# Patient Record
Sex: Male | Born: 1950 | Race: White | Hispanic: No | Smoking: Never smoker
Health system: Southern US, Community
[De-identification: ages and names within clinical notes are randomized; demographics above are authoritative.]

## PROBLEM LIST (undated history)

## (undated) DIAGNOSIS — I2510 Atherosclerotic heart disease of native coronary artery without angina pectoris: Secondary | ICD-10-CM

## (undated) DIAGNOSIS — R0789 Other chest pain: Secondary | ICD-10-CM

## (undated) DIAGNOSIS — J45909 Unspecified asthma, uncomplicated: Secondary | ICD-10-CM

## (undated) DIAGNOSIS — F40240 Claustrophobia: Secondary | ICD-10-CM

## (undated) DIAGNOSIS — N529 Male erectile dysfunction, unspecified: Secondary | ICD-10-CM

## (undated) DIAGNOSIS — F4321 Adjustment disorder with depressed mood: Secondary | ICD-10-CM

## (undated) DIAGNOSIS — G4730 Sleep apnea, unspecified: Secondary | ICD-10-CM

## (undated) DIAGNOSIS — E669 Obesity, unspecified: Secondary | ICD-10-CM

## (undated) DIAGNOSIS — I10 Essential (primary) hypertension: Secondary | ICD-10-CM

## (undated) DIAGNOSIS — E785 Hyperlipidemia, unspecified: Secondary | ICD-10-CM

## (undated) DIAGNOSIS — F319 Bipolar disorder, unspecified: Secondary | ICD-10-CM

## (undated) DIAGNOSIS — R600 Localized edema: Secondary | ICD-10-CM

## (undated) DIAGNOSIS — R0602 Shortness of breath: Secondary | ICD-10-CM

## (undated) DIAGNOSIS — F419 Anxiety disorder, unspecified: Secondary | ICD-10-CM

## (undated) DIAGNOSIS — M1990 Unspecified osteoarthritis, unspecified site: Secondary | ICD-10-CM

## (undated) HISTORY — DX: Bipolar disorder, unspecified: F31.9

## (undated) HISTORY — DX: Sleep apnea, unspecified: G47.30

## (undated) HISTORY — DX: Unspecified asthma, uncomplicated: J45.909

## (undated) HISTORY — PX: TONSILLECTOMY: SUR1361

## (undated) HISTORY — DX: Obesity, unspecified: E66.9

## (undated) HISTORY — DX: Adjustment disorder with depressed mood: F43.21

## (undated) HISTORY — DX: Anxiety disorder, unspecified: F41.9

## (undated) HISTORY — DX: Localized edema: R60.0

## (undated) HISTORY — DX: Shortness of breath: R06.02

## (undated) HISTORY — DX: Male erectile dysfunction, unspecified: N52.9

## (undated) HISTORY — DX: Essential (primary) hypertension: I10

## (undated) HISTORY — DX: Claustrophobia: F40.240

## (undated) HISTORY — DX: Atherosclerotic heart disease of native coronary artery without angina pectoris: I25.10

## (undated) HISTORY — DX: Hyperlipidemia, unspecified: E78.5

## (undated) HISTORY — DX: Other chest pain: R07.89

## (undated) HISTORY — DX: Unspecified osteoarthritis, unspecified site: M19.90

## (undated) HISTORY — PX: TONSILLECTOMY AND ADENOIDECTOMY: SHX28

---

## 2003-08-20 ENCOUNTER — Ambulatory Visit (HOSPITAL_COMMUNITY): Admission: RE | Admit: 2003-08-20 | Discharge: 2003-08-20 | Payer: Self-pay | Admitting: Orthopedic Surgery

## 2004-08-26 ENCOUNTER — Ambulatory Visit: Payer: Self-pay | Admitting: Internal Medicine

## 2005-05-21 ENCOUNTER — Ambulatory Visit: Payer: Self-pay | Admitting: Internal Medicine

## 2005-10-01 ENCOUNTER — Ambulatory Visit: Payer: Self-pay | Admitting: Internal Medicine

## 2006-08-30 ENCOUNTER — Ambulatory Visit: Payer: Self-pay | Admitting: Internal Medicine

## 2006-08-30 LAB — CONVERTED CEMR LAB
ALT: 49 units/L — ABNORMAL HIGH (ref 0–40)
AST: 30 units/L (ref 0–37)
Albumin: 3.8 g/dL (ref 3.5–5.2)
Alkaline Phosphatase: 57 units/L (ref 39–117)
BUN: 20 mg/dL (ref 6–23)
Basophils Absolute: 0 10*3/uL (ref 0.0–0.1)
Basophils Relative: 0.6 % (ref 0.0–1.0)
Bilirubin Urine: NEGATIVE
CO2: 30 meq/L (ref 19–32)
Calcium: 9.5 mg/dL (ref 8.4–10.5)
Chloride: 103 meq/L (ref 96–112)
Chol/HDL Ratio, serum: 7.2
Cholesterol: 206 mg/dL (ref 0–200)
Creatinine, Ser: 1.2 mg/dL (ref 0.4–1.5)
Eosinophil percent: 4.7 % (ref 0.0–5.0)
GFR calc non Af Amer: 66 mL/min
Glomerular Filtration Rate, Af Am: 80 mL/min/{1.73_m2}
Glucose, Bld: 104 mg/dL — ABNORMAL HIGH (ref 70–99)
HCT: 48.4 % (ref 39.0–52.0)
HDL: 28.6 mg/dL — ABNORMAL LOW (ref >39.0)
Hemoglobin, Urine: NEGATIVE
Hemoglobin: 16.3 g/dL (ref 13.0–17.0)
Hgb A1c MFr Bld: 5.1 % (ref 4.6–6.0)
Ketones, ur: NEGATIVE mg/dL
LDL DIRECT: 154.9 mg/dL
Leukocytes, UA: NEGATIVE
Lymphocytes Relative: 27.2 % (ref 12.0–46.0)
MCHC: 33.7 g/dL (ref 30.0–36.0)
MCV: 89.7 fL (ref 78.0–100.0)
Monocytes Absolute: 0.7 10*3/uL (ref 0.2–0.7)
Monocytes Relative: 12.1 % — ABNORMAL HIGH (ref 3.0–11.0)
Neutro Abs: 2.9 10*3/uL (ref 1.4–7.7)
Neutrophils Relative %: 55.4 % (ref 43.0–77.0)
Nitrite: NEGATIVE
PSA: 0.49 ng/mL (ref 0.10–4.00)
Platelets: 208 10*3/uL (ref 150–400)
Potassium: 3.8 meq/L (ref 3.5–5.1)
RBC: 5.4 M/uL (ref 4.22–5.81)
RDW: 12 % (ref 11.5–14.6)
Sodium: 138 meq/L (ref 135–145)
Specific Gravity, Urine: >=1.03 (ref 1.000–1.03)
TSH: 1.77 microintl units/mL (ref 0.35–5.50)
Total Bilirubin: 1.1 mg/dL (ref 0.3–1.2)
Total Protein, Urine: NEGATIVE mg/dL
Total Protein: 7.3 g/dL (ref 6.0–8.3)
Triglyceride fasting, serum: 97 mg/dL (ref 0–149)
Urine Glucose: NEGATIVE mg/dL
Urobilinogen, UA: 0.2 (ref 0.0–1.0)
VLDL: 19 mg/dL (ref 0–40)
WBC: 5.4 10*3/uL (ref 4.5–10.5)
pH: 5 (ref 5.0–8.0)

## 2006-09-07 ENCOUNTER — Ambulatory Visit: Payer: Self-pay | Admitting: Internal Medicine

## 2006-10-11 ENCOUNTER — Ambulatory Visit: Payer: Self-pay | Admitting: Internal Medicine

## 2006-10-25 DIAGNOSIS — R0789 Other chest pain: Secondary | ICD-10-CM

## 2006-10-25 HISTORY — PX: CARDIAC CATHETERIZATION: SHX172

## 2006-10-25 HISTORY — DX: Other chest pain: R07.89

## 2006-11-14 ENCOUNTER — Ambulatory Visit: Payer: Self-pay | Admitting: Gastroenterology

## 2006-12-14 ENCOUNTER — Ambulatory Visit: Payer: Self-pay | Admitting: Internal Medicine

## 2006-12-14 LAB — CONVERTED CEMR LAB: Testosterone: 445.63 ng/dL (ref 350.00–890)

## 2007-02-13 ENCOUNTER — Observation Stay (HOSPITAL_COMMUNITY): Admission: AD | Admit: 2007-02-13 | Discharge: 2007-02-16 | Payer: Self-pay | Admitting: Internal Medicine

## 2007-02-13 ENCOUNTER — Ambulatory Visit: Payer: Self-pay | Admitting: Internal Medicine

## 2007-02-14 ENCOUNTER — Ambulatory Visit: Payer: Self-pay | Admitting: Internal Medicine

## 2007-02-15 ENCOUNTER — Ambulatory Visit: Payer: Self-pay | Admitting: Internal Medicine

## 2007-02-15 ENCOUNTER — Encounter: Payer: Self-pay | Admitting: Internal Medicine

## 2007-04-11 ENCOUNTER — Ambulatory Visit: Payer: Self-pay | Admitting: Internal Medicine

## 2007-04-11 DIAGNOSIS — G4730 Sleep apnea, unspecified: Secondary | ICD-10-CM | POA: Insufficient documentation

## 2007-04-11 DIAGNOSIS — I10 Essential (primary) hypertension: Secondary | ICD-10-CM | POA: Insufficient documentation

## 2007-04-11 DIAGNOSIS — K219 Gastro-esophageal reflux disease without esophagitis: Secondary | ICD-10-CM

## 2007-04-11 DIAGNOSIS — M79609 Pain in unspecified limb: Secondary | ICD-10-CM

## 2007-04-20 ENCOUNTER — Telehealth: Payer: Self-pay | Admitting: Internal Medicine

## 2007-05-01 ENCOUNTER — Encounter: Payer: Self-pay | Admitting: Internal Medicine

## 2007-05-02 ENCOUNTER — Telehealth (INDEPENDENT_AMBULATORY_CARE_PROVIDER_SITE_OTHER): Payer: Self-pay | Admitting: *Deleted

## 2007-05-10 ENCOUNTER — Encounter: Payer: Self-pay | Admitting: Internal Medicine

## 2007-05-11 ENCOUNTER — Encounter: Payer: Self-pay | Admitting: Neurology

## 2007-06-16 ENCOUNTER — Ambulatory Visit: Payer: Self-pay | Admitting: Cardiology

## 2007-10-26 HISTORY — PX: COLONOSCOPY: SHX174

## 2007-10-26 HISTORY — PX: OTHER SURGICAL HISTORY: SHX169

## 2008-04-17 ENCOUNTER — Ambulatory Visit: Payer: Self-pay | Admitting: Internal Medicine

## 2008-04-17 LAB — CONVERTED CEMR LAB
ALT: 24 units/L (ref 0–53)
AST: 21 units/L (ref 0–37)
Albumin: 3.9 g/dL (ref 3.5–5.2)
Alkaline Phosphatase: 65 units/L (ref 39–117)
Bilirubin, Direct: 0.1 mg/dL (ref 0.0–0.3)
Estradiol: 22.3 pg/mL
Free T4: 0.8 ng/dL (ref 0.6–1.6)
LH: 4 milliintl units/mL
PSA: 0.48 ng/mL (ref 0.10–4.00)
Prolactin: 8.9 ng/mL
TSH: 1.45 microintl units/mL (ref 0.35–5.50)
Testosterone: 362.36 ng/dL (ref 350.00–890)
Total Bilirubin: 1.2 mg/dL (ref 0.3–1.2)
Total Protein: 7.5 g/dL (ref 6.0–8.3)

## 2008-05-22 ENCOUNTER — Emergency Department (HOSPITAL_COMMUNITY): Admission: EM | Admit: 2008-05-22 | Discharge: 2008-05-22 | Payer: Self-pay | Admitting: *Deleted

## 2008-06-19 ENCOUNTER — Encounter: Payer: Self-pay | Admitting: Internal Medicine

## 2008-06-24 ENCOUNTER — Encounter: Payer: Self-pay | Admitting: Internal Medicine

## 2008-08-06 ENCOUNTER — Ambulatory Visit: Payer: Self-pay | Admitting: Internal Medicine

## 2008-08-06 DIAGNOSIS — R609 Edema, unspecified: Secondary | ICD-10-CM | POA: Insufficient documentation

## 2008-08-09 ENCOUNTER — Encounter: Payer: Self-pay | Admitting: Internal Medicine

## 2008-08-11 ENCOUNTER — Encounter: Admission: RE | Admit: 2008-08-11 | Discharge: 2008-08-11 | Payer: Self-pay | Admitting: Orthopedic Surgery

## 2008-08-12 LAB — CONVERTED CEMR LAB
ALT: 38 units/L (ref 0–53)
AST: 25 units/L (ref 0–37)
Albumin: 4.4 g/dL (ref 3.5–5.2)
Alkaline Phosphatase: 67 units/L (ref 39–117)
Bilirubin, Direct: 0.1 mg/dL (ref 0.0–0.3)
Cholesterol: 262 mg/dL — ABNORMAL HIGH (ref 0–200)
HDL: 62 mg/dL (ref >39)
Hgb A1c MFr Bld: 5.7 % (ref 4.6–6.1)
Indirect Bilirubin: 0.6 mg/dL (ref 0.0–0.9)
LDL Cholesterol: 169 mg/dL — ABNORMAL HIGH (ref 0–99)
TSH: 2.065 microintl units/mL (ref 0.350–4.50)
Total Bilirubin: 0.7 mg/dL (ref 0.3–1.2)
Total CHOL/HDL Ratio: 4.2
Total Protein: 7.5 g/dL (ref 6.0–8.3)
Triglycerides: 155 mg/dL — ABNORMAL HIGH (ref <150)
VLDL: 31 mg/dL (ref 0–40)

## 2008-11-13 ENCOUNTER — Telehealth (INDEPENDENT_AMBULATORY_CARE_PROVIDER_SITE_OTHER): Payer: Self-pay | Admitting: *Deleted

## 2009-04-03 ENCOUNTER — Ambulatory Visit: Payer: Self-pay | Admitting: Internal Medicine

## 2009-04-07 LAB — CONVERTED CEMR LAB
ALT: 35 units/L (ref 0–53)
AST: 28 units/L (ref 0–37)
Albumin: 4.1 g/dL (ref 3.5–5.2)
Alkaline Phosphatase: 70 units/L (ref 39–117)
BUN: 26 mg/dL — ABNORMAL HIGH (ref 6–23)
Basophils Absolute: 0 10*3/uL (ref 0.0–0.1)
Basophils Relative: 0.6 % (ref 0.0–3.0)
Bilirubin, Direct: 0.2 mg/dL (ref 0.0–0.3)
CO2: 29 meq/L (ref 19–32)
Calcium: 9.6 mg/dL (ref 8.4–10.5)
Chloride: 105 meq/L (ref 96–112)
Cholesterol: 220 mg/dL — ABNORMAL HIGH (ref 0–200)
Creatinine, Ser: 1.3 mg/dL (ref 0.4–1.5)
Direct LDL: 152.8 mg/dL
Eosinophils Absolute: 0.2 10*3/uL (ref 0.0–0.7)
Eosinophils Relative: 4.9 % (ref 0.0–5.0)
GFR calc non Af Amer: 59.9 mL/min (ref >60)
Glucose, Bld: 110 mg/dL — ABNORMAL HIGH (ref 70–99)
HCT: 44.1 % (ref 39.0–52.0)
HDL: 49.9 mg/dL (ref >39.00)
Hemoglobin: 15.6 g/dL (ref 13.0–17.0)
Hgb A1c MFr Bld: 5.5 % (ref 4.6–6.5)
Lymphocytes Relative: 27.8 % (ref 12.0–46.0)
Lymphs Abs: 1.4 10*3/uL (ref 0.7–4.0)
MCHC: 35.4 g/dL (ref 30.0–36.0)
MCV: 89 fL (ref 78.0–100.0)
Monocytes Absolute: 0.5 10*3/uL (ref 0.1–1.0)
Monocytes Relative: 9.6 % (ref 3.0–12.0)
Neutro Abs: 2.8 10*3/uL (ref 1.4–7.7)
Neutrophils Relative %: 57.1 % (ref 43.0–77.0)
PSA: 0.54 ng/mL (ref 0.10–4.00)
Platelets: 175 10*3/uL (ref 150.0–400.0)
Potassium: 4.6 meq/L (ref 3.5–5.1)
RBC: 4.95 M/uL (ref 4.22–5.81)
RDW: 13.1 % (ref 11.5–14.6)
Sodium: 142 meq/L (ref 135–145)
TSH: 1.17 microintl units/mL (ref 0.35–5.50)
Total Bilirubin: 1.3 mg/dL — ABNORMAL HIGH (ref 0.3–1.2)
Total CHOL/HDL Ratio: 4
Total Protein: 7.3 g/dL (ref 6.0–8.3)
Triglycerides: 83 mg/dL (ref 0.0–149.0)
VLDL: 16.6 mg/dL (ref 0.0–40.0)
WBC: 4.9 10*3/uL (ref 4.5–10.5)

## 2009-04-08 ENCOUNTER — Encounter (INDEPENDENT_AMBULATORY_CARE_PROVIDER_SITE_OTHER): Payer: Self-pay | Admitting: *Deleted

## 2009-06-24 ENCOUNTER — Ambulatory Visit: Payer: Self-pay | Admitting: Internal Medicine

## 2009-06-24 DIAGNOSIS — R5381 Other malaise: Secondary | ICD-10-CM

## 2009-06-24 DIAGNOSIS — R519 Headache, unspecified: Secondary | ICD-10-CM | POA: Insufficient documentation

## 2009-06-24 DIAGNOSIS — R51 Headache: Secondary | ICD-10-CM

## 2009-06-24 DIAGNOSIS — R5383 Other fatigue: Secondary | ICD-10-CM

## 2009-06-25 ENCOUNTER — Encounter: Payer: Self-pay | Admitting: Internal Medicine

## 2010-01-19 ENCOUNTER — Telehealth (INDEPENDENT_AMBULATORY_CARE_PROVIDER_SITE_OTHER): Payer: Self-pay | Admitting: *Deleted

## 2010-04-13 ENCOUNTER — Telehealth (INDEPENDENT_AMBULATORY_CARE_PROVIDER_SITE_OTHER): Payer: Self-pay | Admitting: *Deleted

## 2010-05-26 ENCOUNTER — Telehealth (INDEPENDENT_AMBULATORY_CARE_PROVIDER_SITE_OTHER): Payer: Self-pay | Admitting: *Deleted

## 2010-05-27 ENCOUNTER — Ambulatory Visit: Payer: Self-pay | Admitting: Internal Medicine

## 2010-05-27 LAB — CONVERTED CEMR LAB
ALT: 25 units/L (ref 0–53)
AST: 20 units/L (ref 0–37)
Albumin: 4 g/dL (ref 3.5–5.2)
Alkaline Phosphatase: 67 units/L (ref 39–117)
BUN: 29 mg/dL — ABNORMAL HIGH (ref 6–23)
Basophils Absolute: 0 10*3/uL (ref 0.0–0.1)
Basophils Relative: 0.7 % (ref 0.0–3.0)
Bilirubin, Direct: 0.1 mg/dL (ref 0.0–0.3)
CO2: 30 meq/L (ref 19–32)
Calcium: 9.3 mg/dL (ref 8.4–10.5)
Chloride: 99 meq/L (ref 96–112)
Cholesterol: 238 mg/dL — ABNORMAL HIGH (ref 0–200)
Creatinine, Ser: 1.1 mg/dL (ref 0.4–1.5)
Direct LDL: 170.9 mg/dL
Eosinophils Absolute: 0.3 10*3/uL (ref 0.0–0.7)
Eosinophils Relative: 4.4 % (ref 0.0–5.0)
GFR calc non Af Amer: 71.6 mL/min (ref >60)
Glucose, Bld: 89 mg/dL (ref 70–99)
HCT: 43 % (ref 39.0–52.0)
HDL: 42.7 mg/dL (ref >39.00)
Hemoglobin: 15 g/dL (ref 13.0–17.0)
Hgb A1c MFr Bld: 5.6 % (ref 4.6–6.5)
Lymphocytes Relative: 30.7 % (ref 12.0–46.0)
Lymphs Abs: 2.3 10*3/uL (ref 0.7–4.0)
MCHC: 34.8 g/dL (ref 30.0–36.0)
MCV: 90.4 fL (ref 78.0–100.0)
Monocytes Absolute: 0.7 10*3/uL (ref 0.1–1.0)
Monocytes Relative: 8.9 % (ref 3.0–12.0)
Neutro Abs: 4.1 10*3/uL (ref 1.4–7.7)
Neutrophils Relative %: 55.3 % (ref 43.0–77.0)
PSA: 0.4 ng/mL (ref 0.10–4.00)
Platelets: 217 10*3/uL (ref 150.0–400.0)
Potassium: 4.3 meq/L (ref 3.5–5.1)
RBC: 4.76 M/uL (ref 4.22–5.81)
RDW: 13.8 % (ref 11.5–14.6)
Sodium: 137 meq/L (ref 135–145)
TSH: 1.85 microintl units/mL (ref 0.35–5.50)
Total Bilirubin: 0.9 mg/dL (ref 0.3–1.2)
Total CHOL/HDL Ratio: 6
Total Protein: 7.2 g/dL (ref 6.0–8.3)
Triglycerides: 121 mg/dL (ref 0.0–149.0)
VLDL: 24.2 mg/dL (ref 0.0–40.0)
WBC: 7.3 10*3/uL (ref 4.5–10.5)

## 2010-05-29 ENCOUNTER — Ambulatory Visit: Payer: Self-pay | Admitting: Internal Medicine

## 2010-05-29 DIAGNOSIS — E785 Hyperlipidemia, unspecified: Secondary | ICD-10-CM | POA: Insufficient documentation

## 2010-05-29 DIAGNOSIS — J45909 Unspecified asthma, uncomplicated: Secondary | ICD-10-CM | POA: Insufficient documentation

## 2010-08-21 ENCOUNTER — Encounter: Payer: Self-pay | Admitting: Cardiology

## 2010-08-21 ENCOUNTER — Ambulatory Visit: Payer: Self-pay | Admitting: Cardiology

## 2010-08-28 ENCOUNTER — Encounter: Payer: Self-pay | Admitting: Internal Medicine

## 2010-08-28 ENCOUNTER — Encounter: Admission: RE | Admit: 2010-08-28 | Discharge: 2010-08-28 | Payer: Self-pay | Admitting: Allergy

## 2010-09-02 ENCOUNTER — Encounter: Payer: Self-pay | Admitting: Internal Medicine

## 2010-10-09 ENCOUNTER — Telehealth: Payer: Self-pay | Admitting: Internal Medicine

## 2010-10-13 ENCOUNTER — Encounter: Payer: Self-pay | Admitting: Internal Medicine

## 2010-10-13 ENCOUNTER — Ambulatory Visit: Payer: Self-pay | Admitting: Internal Medicine

## 2010-10-14 LAB — CONVERTED CEMR LAB: Sed Rate: 13 mm/hr (ref 0–22)

## 2010-11-13 ENCOUNTER — Encounter: Payer: Self-pay | Admitting: Internal Medicine

## 2010-11-14 ENCOUNTER — Encounter: Payer: Self-pay | Admitting: Orthopedic Surgery

## 2010-11-16 ENCOUNTER — Other Ambulatory Visit: Payer: Self-pay | Admitting: Neurosurgery

## 2010-11-19 ENCOUNTER — Encounter
Admission: RE | Admit: 2010-11-19 | Discharge: 2010-11-19 | Payer: Self-pay | Source: Home / Self Care | Attending: Neurosurgery | Admitting: Neurosurgery

## 2010-11-22 LAB — CONVERTED CEMR LAB
CK-MB: 3.3 ng/mL (ref 0.3–4.0)
Cholesterol, target level: 200 mg/dL
Free T4: 0.8 ng/dL (ref 0.6–1.6)
HDL goal, serum: 40 mg/dL
LDL Goal: 130 mg/dL
T3, Free: 3.1 pg/mL (ref 2.3–4.2)
TSH: 1.65 microintl units/mL (ref 0.35–5.50)
Total CK: 125 units/L (ref 7–195)
Troponin I: 0.11 ng/mL — ABNORMAL HIGH (ref <0.06)

## 2010-11-26 NOTE — Letter (Signed)
Summary: [facility]  [facility]   Imported By: Lanelle Bal 09/18/2010 08:53:48  _____________________________________________________________________  External Attachment:    Type:   Image     Comment:   External Document

## 2010-11-26 NOTE — Miscellaneous (Signed)
Summary: Appointment Canceled  Appointment status changed to canceled by [employer] on 09/02/2010 12:58 PM.  Cancellation Comments --------------------- echo dx 272.4/bcbs/sl  Appointment Information ----------------------- Appt Type:  CARDIOLOGY ANCILLARY VISIT      Date:  Wednesday, September 09, 2010      Time:  1:00 PM for 60 min   Urgency:  Routine   Made By:  Hoy Finlay Scheduler  To Visit:  [id number]    Reason:  echo dx 272.4/bcbs/sl  Appt Comments ------------- -- 09/02/10 12:58: ([facility]EM[facility]) CANCELED -- echo dx 272.4/bcbs/sl -- 08/21/10 9:58: ([facility]EM[facility]) BOOKED -- Routine CARDIOLOGY ANCILLARY VISIT at 09/09/2010 1:00 PM for 60 min echo dx 272.4/bcbs/sl -- 08/21/10 9:48: ([facility]) BOOKED -- Routine CARDIOLOGY ANCILLARY V

## 2010-11-26 NOTE — Progress Notes (Signed)
Summary: verapamil and micardis  Phone Note Refill Request Message from:  Pharmacy on [facility] Fax #: [phone]  Refills Requested: Medication #1:  VERAPAMIL HCL CR 120 MG XR24H-CAP 1 once daily   Dosage confirmed as above?Dosage Confirmed   Supply Requested: 1 month   Last Refilled: 12/18/2009  Medication #2:  MICARDIS HCT 80-25 MG TABS 1 by mouth once daily   Dosage confirmed as above?Dosage Confirmed   Supply Requested: 1 month   Notes: per patient request Next Appointment Scheduled: none Initial call taken by: Harold Barban,  January 19, 2010 8:52 AM    Prescriptions: VERAPAMIL HCL CR 120 MG XR24H-CAP (VERAPAMIL HCL) 1 once daily  #30 Each x 2   Entered by:   Doristine Devoid   Authorized by:   Marga Melnick MD   Signed by:   Doristine Devoid on 01/19/2010   Method used:   Electronically to        [facility]* (retail)       [address]       [place], [place]  [address]       Ph: [phone]       Fax: [phone]   RxID:   [id number] MICARDIS HCT 80-25 MG TABS (TELMISARTAN-HCTZ) 1 by mouth once daily  #30 x 2   Entered by:   Doristine Devoid   Authorized by:   Marga Melnick MD   Signed by:   Doristine Devoid on 01/19/2010   Method used:   Electronically to        [facility]* (retail)       [address]       [place], [place]  [id number]       Ph: [phone]       Fax: [phone]   RxID:   [id number]

## 2010-11-26 NOTE — Progress Notes (Signed)
Summary: REFILL REQUEST  Phone Note Refill Request Call back at [phone] Message from:  Pharmacy on April 13, 2010 2:51 PM  Refills Requested: Medication #1:  VERAPAMIL HCL CR 120 MG XR24H-CAP 1 once daily   Dosage confirmed as above?Dosage Confirmed   Supply Requested: 1 month [facility]  Next Appointment Scheduled: NONE Initial call taken by: Lavell Islam,  April 13, 2010 2:51 PM    Prescriptions: VERAPAMIL HCL CR 120 MG XR24H-CAP (VERAPAMIL HCL) 1 once daily  #30 Each x 2   Entered by:   Shonna Chock   Authorized by:   Marga Melnick MD   Signed by:   Shonna Chock on 04/13/2010   Method used:   Electronically to        [facility]* (retail)       [address]       [place], [place]  [address]       Ph: [phone]       Fax: [phone]   RxID:   [id number]

## 2010-11-26 NOTE — Progress Notes (Signed)
Summary: Refill Request  Phone Note Refill Request Call back at [phone] Message from:  Pharmacy on April 13, 2010 2:17 PM  Refills Requested: Medication #1:  MICARDIS HCT 80-25 MG TABS 1 by mouth once daily   Dosage confirmed as above?Dosage Confirmed   Supply Requested: 1 month   Last Refilled: 03/16/2010 [facility]  Next Appointment Scheduled: none Initial call taken by: Harold Barban,  April 13, 2010 2:17 PM    Prescriptions: MICARDIS HCT 80-25 MG TABS (TELMISARTAN-HCTZ) 1 by mouth once daily  #30 x 2   Entered by:   Shonna Chock   Authorized by:   Marga Melnick MD   Signed by:   Shonna Chock on 04/13/2010   Method used:   Electronically to        [facility]* (retail)       [address]       [place], [place]  [address]       Ph: [phone]       Fax: [phone]   RxID:   [id number]

## 2010-11-26 NOTE — Assessment & Plan Note (Signed)
Summary: bp check/cbs   Vital Signs:  Patient profile:   60 year old male Weight:      320.2 pounds Pulse rate:   72 / minute Resp:     12 per minute BP sitting:   138 / 80  (left arm) Cuff size:   large  Vitals Entered By: Shonna Chock CMA (May 29, 2010 7:59 AM) CC: Follow-up visit: discuss labs (copy given) and B/P, Lipid Management   CC:  Follow-up visit: discuss labs (copy given) and B/P and Lipid Management.  History of Present Illness:  Hypertension Follow-Up      This is a 60 year old man who presents for Hypertension follow-up.  The patient reports  occasional lightheadedness especially in the heat, but denies urinary frequency, headaches, and fatigue.  Associated symptoms include dyspnea( EIB variant) and pedal edema(this has improved).  The patient denies the following associated symptoms: chest pain, chest pressure, exercise intolerance, palpitations, syncope, and leg edema.  Compliance with medications (by patient report) has been near 100%.  The patient reports that dietary compliance has been good.  The patient reports exercising daily.  Adjunctive measures currently used by the patient include salt restriction.  BP 125-135/70-84. Hyperlipidemia Follow-Up      The patient also presents for Hyperlipidemia follow-up.  The patient reported  muscle aches and  loose stool with several statins( Crestor , Zocor, Lipitor)  & Zetia. He  denied  abdominal pain, flushing, itching, and constipation with these.  Adjunctive measures currently used by the patient include ASA and fish oil supplements.  LDL 171 ; NMR reviewed & risk discussed.                                                                        His asthma has flared slightly with the recent intense heat. No rescue inhaler use .  Lipid Management History:      Positive NCEP/ATP III risk factors include male age 21 years old or older and hypertension.  Negative NCEP/ATP III risk factors include non-diabetic, no family  history for ischemic heart disease, non-tobacco-user status, no ASHD (atherosclerotic heart disease), no prior stroke/TIA, no peripheral vascular disease, and no history of aortic aneurysm.     Preventive Screening-Counseling & Management  Alcohol-Tobacco     Smoking Status: quit  Allergies: 1)  ! * Advair 2)  ! * Maxair 3)  ! Toprol Xl (Metoprolol Succinate)  Past History:  Past Medical History: Non cardiac chest and arm pain 2008, Dr Charlies Constable Positive risk factor for CAD with a positive family hx: Maternal FH CAD Hyperlipidemia: NMR 2006: LDL 045(4098/ 1907), HDL 43, TG 82. LDL goal = < 100. Framingham Study LDL goal = < 130.  Family History: Father: Dementia, Pancreatic cancer, Glaucoma, Prostate cancer, PMR Mother: suicide @ 44 Siblings: sister & bro overweight; M uncles: MI in 37s; M cousins: MI in 62s; MGF: MI or CVA  Social History: Occupation: Ophthalmologist Married Alcohol use-yes: socially Regular exercise-yes until recent family issues Former Smoker Smoking Status:  quit  Review of Systems CV:  Denies difficulty breathing at night and difficulty breathing while lying down. Resp:  Denies cough and sputum productive.  Physical Exam  General:  well-nourished; alert,appropriate and  cooperative throughout examination Neck:  No deformities, masses, or tenderness noted. Lungs:  Normal respiratory effort, chest expands symmetrically. Lungs are clear to auscultation, no crackles or wheezes. Heart:  Normal rate and regular rhythm. S1 and S2 normal without gallop, murmur, click, rub . Abdomen:  Bowel sounds positive,abdomen soft and non-tender without masses, organomegaly or hernias noted. Pulses:  R and L carotid,radial,dorsalis pedis and posterior tibial pulses are full and equal bilaterally Extremities:  No clubbing, cyanosis, edema Neurologic:  alert & oriented X3 and DTRs symmetrical and normal.   Skin:  Intact without suspicious lesions or rashes Psych:   memory intact for recent and remote, normally interactive, and good eye contact.     Impression & Recommendations:  Problem # 1:  HYPERTENSION, ESSENTIAL NOS (ICD-401.9)  The following medications were removed from the medication list:    Micardis Hct 80-25 Mg Tabs (Telmisartan-hctz) .Marland Kitchen... 1 by mouth once daily His updated medication list for this problem includes:    Furosemide 40 Mg Tabs (Furosemide) .Marland Kitchen... 1/2 - 1 once daily as needed    Verapamil Hcl Cr 120 Mg Xr24h-cap (Verapamil hcl) .Marland Kitchen... 1 once daily    Micardis Hct 80-12.5 Mg Tabs (Telmisartan-hctz) .Marland Kitchen... 1 once daily  Problem # 2:  HYPERLIPIDEMIA (ICD-272.4)  His updated medication list for this problem includes:    Pravastatin Sodium 20 Mg Tabs (Pravastatin sodium) .Marland Kitchen... 1 at bedtime  Problem # 3:  ASTHMA (ICD-493.90) Excellent control His updated medication list for this problem includes:    Symbicort 160-4.5 Mcg/act Aero (Budesonide-formoterol fumarate) .Marland Kitchen... Take two puffs twice daily.  Complete Medication List: 1)  Viagra 100 Mg Tabs (Sildenafil citrate) .Marland Kitchen.. 1 once daily prn 2)  Symbicort 160-4.5 Mcg/act Aero (Budesonide-formoterol fumarate) .... Take two puffs twice daily. 3)  Furosemide 40 Mg Tabs (Furosemide) .... 1/2 - 1 once daily as needed 4)  Cialis 20 Mg Tabs (Tadalafil) .Marland Kitchen.. 1 every 3 days as needed 5)  Nasel Spray  .... As needed 6)  Bayer Low Strength 81 Mg Tbec (Aspirin) .Marland Kitchen.. 1 by mouth once daily 7)  Verapamil Hcl Cr 120 Mg Xr24h-cap (Verapamil hcl) .Marland Kitchen.. 1 once daily 8)  Micardis Hct 80-12.5 Mg Tabs (Telmisartan-hctz) .Marland Kitchen.. 1 once daily 9)  Pravastatin Sodium 20 Mg Tabs (Pravastatin sodium) .Marland Kitchen.. 1 at bedtime  Lipid Assessment/Plan:      Based on NCEP/ATP III, the patient's risk factor category is "2 or more risk factors and a calculated 10 year CAD risk of < 20%".  The patient's lipid goals are as follows: Total cholesterol goal is 200; LDL cholesterol goal is 130; HDL cholesterol goal is 40;  Triglyceride goal is 150.    Patient Instructions: 1)  It is important that you exercise regularly at least 20 minutes 5 times a week. If you develop chest pain, have severe difficulty breathing, or feel very tired , stop exercising immediately and seek medical attention. 2)  Take an  81 mg coated Aspirin every day. 3)  Please schedule a follow-up appointment in 3 months. 4)  Hepatic Panel , CPK prior to visit, ICD-9:995.20 5)  Lipid Panel prior to visit, ICD-9:272.4, V17.3 Prescriptions: PRAVASTATIN SODIUM 20 MG TABS (PRAVASTATIN SODIUM) 1 at bedtime  #90 x 0   Entered and Authorized by:   Marga Melnick MD   Signed by:   Marga Melnick MD on 05/29/2010   Method used:   Print then Give to Patient   RxID:   [id number] VERAPAMIL HCL CR 120 MG XR24H-CAP (VERAPAMIL HCL)  1 once daily  #90 x 3   Entered and Authorized by:   Marga Melnick MD   Signed by:   Marga Melnick MD on 05/29/2010   Method used:   Print then Give to Patient   RxID:   [id number] MICARDIS HCT 80-12.5 MG TABS (TELMISARTAN-HCTZ) 1 once daily  #90 x 3   Entered and Authorized by:   Marga Melnick MD   Signed by:   Marga Melnick MD on 05/29/2010   Method used:   Print then Give to Patient   RxID:   [id number]

## 2010-11-26 NOTE — Miscellaneous (Signed)
Summary: Orders Update  Clinical Lists Changes  Orders: Added new Service order of EKG w/ Interpretation (93000) - Signed 

## 2010-11-26 NOTE — Assessment & Plan Note (Signed)
Summary: ec6  Medications Added ASTELIN 137 MCG/SPRAY SOLN (AZELASTINE HCL) use as directed FISH OIL 1000 MG CAPS (OMEGA-3 FATTY ACIDS) 1 cap once daily * MACULAR COMPLETE 1 tab once daily        Visit Type:  EC6  CC:  pt last seen in office in 2008.....  History of Present Illness: [name] is 60 years old and came for followup management of hypertension, hyperlipidemia, and preventive cardiovascular maintenance. He was hospitalized in 2008 with chest pain and underwent cardiac catheterization and had normal coronary arteries. He had an echocardiogram that time which showed some LVH with a septal thickness of 16 mm and a posterior wall thickness of 12 mm.  He is done well since that time from the standpoint of his heart and has had no recent cardiac symptoms.  He does have a problem with excess weight hyperlipidemia and hypertension. Hypertension skin well controlled but his lipids have been difficult to control. He's been intolerant to beta blockers and his been intolerant to multiple statins including Crestor Zocor Lipitor and steady it. [name] recently started on pravastatin. His last lipid profile in August showed an LDL 170 and a total cholesterol of 238. His weight today was 319 pounds.  Current Medications (verified): 1)  Viagra 100 Mg  Tabs (Sildenafil Citrate) .Marland Kitchen.. 1 Once Daily Prn 2)  Symbicort 160-4.5 Mcg/act Aero (Budesonide-Formoterol Fumarate) .... Take Two Puffs Twice Daily. 3)  Cialis 20 Mg Tabs (Tadalafil) .Marland Kitchen.. 1 Every 3 Days As Needed 4)  Astelin 137 Mcg/spray Soln (Azelastine Hcl) .... Use As Directed 5)  Bayer Low Strength 81 Mg Tbec (Aspirin) .Marland Kitchen.. 1 By Mouth Once Daily 6)  Verapamil Hcl Cr 120 Mg Xr24h-Cap (Verapamil Hcl) .Marland Kitchen.. 1 Once Daily 7)  Micardis Hct 80-12.5 Mg Tabs (Telmisartan-Hctz) .Marland Kitchen.. 1 Once Daily 8)  Pravastatin Sodium 20 Mg Tabs (Pravastatin Sodium) .Marland Kitchen.. 1 At Bedtime 9)  Fish Oil 1000 Mg Caps (Omega-3 Fatty Acids) .Marland Kitchen.. 1 Cap Once Daily 10)   Macular Complete .Marland Kitchen.. 1 Tab Once Daily  Allergies: 1)  ! * Advair 2)  ! * Maxair 3)  ! Toprol Xl (Metoprolol Succinate)  Past History:  Past Medical History: Reviewed history from 05/29/2010 and no changes required. Non cardiac chest and arm pain 2008, Dr Charlies Constable Positive risk factor for CAD with a positive family hx: Maternal FH CAD Hyperlipidemia: NMR 2006: LDL 045(4098/ 1907), HDL 43, TG 82. LDL goal = < 100. Framingham Study LDL goal = < 130.  Review of Systems       ROS is negative except as outlined in HPI.   Vital Signs:  Patient profile:   60 year old male Height:      76 inches Weight:      319.75 pounds BMI:     39.06 Pulse rate:   77 / minute Pulse rhythm:   regular BP sitting:   128 / 86  (left arm) Cuff size:   large  Vitals Entered By: Danielle Rankin, CMA (August 21, 2010 9:01 AM)  Physical Exam  Additional Exam:  Gen. Well-nourished, in no distress   Neck: No JVD, thyroid not enlarged, no carotid bruits Lungs: No tachypnea, clear without rales, rhonchi or wheezes Cardiovascular: Rhythm regular, PMI not displaced,  heart sounds  normal, no murmurs or gallops, no peripheral edema, pulses normal in all 4 extremities. Abdomen: BS normal, abdomen soft and non-tender without masses or organomegaly, no hepatosplenomegaly. MS: No deformities, no cyanosis or clubbing   Neuro:  No focal sns   Skin:  no lesions    Impression & Recommendations:  Problem # 1:  HYPERTENSION, ESSENTIAL NOS (ICD-401.9) His blood pressure is well controlled on current medications. When he had his previous echocardiogram in [facility]. We will plan to repeat his echocardiogram to assess progression or regression of his hypertrophy. The following medications were removed from the medication list:    Furosemide 40 Mg Tabs (Furosemide) .Marland Kitchen... 1/2 - 1 once daily as needed His updated medication list for this problem includes:    Bayer Low Strength 81 Mg Tbec (Aspirin) .Marland Kitchen... 1 by mouth once  daily    Verapamil Hcl Cr 120 Mg Xr24h-cap (Verapamil hcl) .Marland Kitchen... 1 once daily    Micardis Hct 80-12.5 Mg Tabs (Telmisartan-hctz) .Marland Kitchen... 1 once daily  Orders: EKG w/ Interpretation (93000) Echocardiogram (Echo)  Problem # 2:  HYPERLIPIDEMIA (ICD-272.4) His LDL is quite high. He is now on pravastatin which did help some. The main issue is weight reduction. He understands this is the main issue in we'll try to focus on this and has been working with [name] on this. His updated medication list for this problem includes:    Pravastatin Sodium 20 Mg Tabs (Pravastatin sodium) .Marland Kitchen... 1 at bedtime  Orders: EKG w/ Interpretation (93000) Echocardiogram (Echo)  Patient Instructions: 1)  Your physician wants you to follow-up in:  1 year with Dr. Riley Kill. You will receive a reminder letter in the mail two months in advance. If you don't receive a letter, please call our office to schedule the follow-up appointment. 2)  Your physician has requested that you have an echocardiogram.  Echocardiography is a painless test that uses sound waves to create images of your heart. It provides your doctor with information about the size and shape of your heart and how well your heart's chambers and valves are working.  This procedure takes approximately one hour. There are no restrictions for this procedure.

## 2010-11-26 NOTE — Progress Notes (Signed)
Summary: Pravastatin rx  Phone Note Refill Request   Refills Requested: Medication #1:  PRAVASTATIN SODIUM 20 MG TABS 1 at bedtime RX sent to [facility] per MD  Initial call taken by: Lucious Groves CMA,  October 09, 2010 8:58 AM    Prescriptions: PRAVASTATIN SODIUM 20 MG TABS (PRAVASTATIN SODIUM) 1 at bedtime  #90 x 3   Entered by:   Lucious Groves CMA   Authorized by:   Marga Melnick MD   Signed by:   Lucious Groves CMA on 10/09/2010   Method used:   Electronically to        [facility]* (retail)       [address]       [place], [place]  [id number]       Ph: [phone]       Fax: [phone]   RxID:   [id number]

## 2010-11-26 NOTE — Progress Notes (Signed)
Summary: lab  Phone Note Call from Patient   Caller: Patient Summary of Call: patient has appt at [facility] [phone] - appt [phone] need lab order  Initial call taken by: Okey Regal Spring,  May 26, 2010 8:12 AM  Follow-up for Phone Call        Dr.Hopper please advise is this suppose to be CPX labs, patient not with a CPX appointment pending Follow-up by: Shonna Chock CMA,  May 26, 2010 9:42 AM  Additional Follow-up for Phone Call Additional follow up Details #1::        V70.0:lipids, BMET, hepatic panel, CBC & dif, TSH, PSA Additional Follow-up by: Marga Melnick MD,  May 26, 2010 1:02 PM    Additional Follow-up for Phone Call Additional follow up Details #2::    lab order added to appt.Okey Regal Spring  May 26, 2010 1:56 PM

## 2010-12-02 ENCOUNTER — Encounter: Payer: Self-pay | Admitting: Internal Medicine

## 2010-12-10 NOTE — Letter (Signed)
Summary: [facility]  [facility]   Imported By: Sherian Rein 12/01/2010 10:31:20  _____________________________________________________________________  External Attachment:    Type:   Image     Comment:   External Document

## 2010-12-22 NOTE — Letter (Signed)
Summary: [facility]  [facility]   Imported By: Maryln Gottron 12/15/2010 15:05:30  _____________________________________________________________________  External Attachment:    Type:   Image     Comment:   External Document

## 2011-03-09 NOTE — Assessment & Plan Note (Signed)
[facility]                            CARDIOLOGY OFFICE NOTE   NAME:Acri, DR.Algis                      MRN:          [id number]  DATE:06/16/2007                            DOB:          1951-03-24    PRIMARY CARE PHYSICIAN:  Marga Melnick, M.D.   HISTORY OF PRESENT ILLNESS:  [name] returns for a followup visit after  his catheterization earlier this year for chest pain and for management  of his hypertension. He was hospitalized in April with chest pain and  underwent Myoview perfusion scan, which was borderline abnormal. He  underwent catheterization and was found to have normal coronaries,  although he did have TIMI-2 flow in the right coronary artery. He also  had hypertension and initially put on beta blockers but he developed  cool hands and feet and was switched by Micardis by [name]. Dr.  [name] had done a troponin level in June, when he saw him and it was  0.11 and said he had had some left shoulder pain. He attributed this to  his hypertension. [name] had also had an echocardiogram while he was in  the hospital that showed some LVH with a septal thickness of 16 mm and a  posterior wall thickness of 12 mm.   [name] has done quite well since that time. He has had no recent chest  pain, shortness of breath, or palpitations. He has taken his blood  pressure at home and they have been in the 150's and a systolic  generally in the 80's and occasionally 90 on the diastolic side.   PAST MEDICAL HISTORY:  Significant for asthma, borderline  hyperlipidemia, and his weight.   CURRENT MEDICATIONS:  Include Micardis, Astelin nasal spray, and  aspirin.   PHYSICAL EXAMINATION:  VITAL SIGNS:  Today blood pressure was 130/90 and  pulse 70 and regular.  NECK:  There was no jugular venous distention. Carotid pulses were full  without bruit.  CHEST:  Clear without rales or rhonchi. Rhythm was regular. No murmurs  or gallops.  ABDOMEN:  Soft without  organomegaly.  EXTREMITIES:  Peripheral pulses were full. There is no peripheral edema.   IMPRESSION:  1. Hypertension.  2. Coronary angiography without evidence of obstructive coronary      disease.  3. Good left ventricular function.  4. Borderline hyperlipidemia.   RECOMMENDATIONS:  [name] and I talked about non-pharmacologic means to help  get his blood pressure to target, including weight reduction exercise  and salt restriction. He plans to work on these. I think his blood  pressure elevations are significant in view of his LVD on his  echocardiogram and I think we should adjust his pharmacologic therapy  while he is working on non-pharmacologic treatment. I will change him  from Micardis 80 to Micardis/hydrochlorothiazide 80/25. Will plan to get  a BNP in a week to make sure his  potassium is okay on the hydrochlorothiazide. I will schedule him to  followup in 6 months and he is to monitor his blood pressure and let us  know if he has not reached target of 130/80.  [name], MD, [facility]  Electronically Signed    BRB/MedQ  DD: 06/16/2007  DT: 06/17/2007  Job #: [id number]
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
## 2011-03-12 NOTE — Consult Note (Signed)
[name], [name]                ACCOUNT NO.:  [id number]   MEDICAL RECORD NO.:  [id number]          PATIENT TYPE:  INP   LOCATION:  2006                         FACILITY:  [facility]   PHYSICIAN:  Anthony Beals. Juanda Chance, MD, FACCDATE OF BIRTH:  03/16/1951   DATE OF CONSULTATION:  02/13/2007  DATE OF DISCHARGE:                                 CONSULTATION   Primary care physician is Dr. Marga Oliver.   REASON FOR CONSULTATION:  Evaluation of chest pain.   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  He has had recent symptoms of  depression and has been under some increased stress and workload at work  and has had some arguments with his wife [name] regarding this.  Today,  he developed some substernal chest discomfort and was seen by Dr. Alwyn Oliver  in his office.  An ECG was obtained which showed some T-wave flattening  in the anterior leads which was new from his previous ECG in 2007.  Dr.  Alwyn Oliver transferred him here by ambulance.   His risk profile for vascular disease include his age and sex, excess  weight, although he has lost 40 pounds recently, and a history of an  elevated cholesterol, and family history described below.   He has had no symptoms of shortness of breath, palpitations or  exertional chest pain.   PAST MEDICAL HISTORY:  Significant for a history of depression.  He has  seen Dr. Andee Oliver recently, but is currently on no medications  for this.  His past history is also significant for asthma and elevated  cholesterol.  He indicated that he has a low HDL and increased small  particle size LDL.  He is on no medications for this.  He has been  hypertensive today, but he has never required treatment for this in the  past.   CURRENT MEDICATIONS:  Include only Ambien and aspirin.   SOCIAL HISTORY:  He is married for the second time and has 2 children by  his first wife.  He does not smoke.  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   FAMILY HISTORY:  His mother had 4 sets of twins in the family and 2 of  those sets of twins had heart attacks at a fairly young age.  There is  no heart disease on his father's side.   REVIEW OF SYSTEMS:  Positive for intermittent symptoms of depression.   PHYSICAL EXAMINATION:  VITAL SIGNS:  The blood pressure is 194/101 with  pulse 73 and regular.  NECK:  There was no venous tension.  The carotid pulses were full.  There were no bruits.  CHEST:  Clear without rales or rhonchi.  CARDIAC:  Rhythm was regular.  The first second heart sounds were  normal.  There were no murmurs or gallops.  ABDOMEN:  Soft with normal bowel sounds.  There was no  hepatosplenomegaly.  EXTREMITIES:  The peripheral pulses were full.  There is no peripheral  edema.  Musculoskeletal did not show  no deformities.  SKIN:  Warm and dry.  NEUROLOGIC:  Examination showed no focal neurological signs.   An electrocardiogram showed borderline right bundle branch block and  left axis deviation and T-wave flattening in V1 and V2.   IMPRESSION:  1. Chest pain and abnormal echocardiogram, rule out ischemia.  2. Recent hypertension.  3. History of elevated cholesterol, treated with diet.  4. History depression.  5. History of asthma.   RECOMMENDATIONS:  I talked with [name] about the possible options for  evaluating his chest pain.  I told him I thought a catheterization would  be more definitive, but he is a little bit reluctant to do this, and so  we plan to perform a rest/stress Myoview scan tomorrow.  If the scan is  normal, then we will continue primary prevention.  I discussed with Dr.  Nolen Oliver about further treatment of his depression and we will arrange  for him to see her shortly after discharge.  We will also get cardiac  rehab involved in seeing him.      Anthony Elvera Lennox Juanda Chance, MD, [facility]  Electronically Signed     BRB/MEDQ  D:  02/13/2007  T:   02/14/2007  Job:  [id number]   cc:   [name]. Anthony Ren, MD,FACP,FCCP  [name], M.D.

## 2011-03-12 NOTE — Letter (Signed)
April 14, 2007    Bruce R. Juanda Chance, MD, [facility]  [address]  [place], [place] [address]   RE:  [name], [name]  MRN:  [id number]  /  DOB:  1950/11/28   Dear Smitty Cords,   I saw [facility] emergently, June 17, for elevated blood pressure & left  shoulder pain..  At that time his pressure was  152/98.  He states that  he has been under increased stress at work and has had significant blood  pressure elevations documented.  He had been on Toprol, but he stopped  it, feeling it was causing cold intolerance.  He has also had multiple  other drug intolerances.   His cardiopulmonary exam was unremarkable.  His EKG was actually  improved in reference to the nonspecific ST-T changes pre  catheterization.   I gave him samples of Micardis 40 mg daily, as the ARB class would be  least likely to be associated with any adverse drug effects.  He was  asked to monitor the blood pressure with a goal of 130/85, or less.   He had multiple other concerns, including weight-loss and possible sleep  apnea.   Because of his negative cardiac workup recently in the hospital, and the  improvement in his EKG, I placed him on omeprazole 20 mg before  breakfast and the evening meal.   His troponin was 0.11 with normals less than 0.06.  His CPK was normal  at 125 with MB of 3.3.  Thyroid function tests were normal.   The elevated troponin was most likely related to his hypertension, but I  have left messages at his house on June 18 and June 19, recommending  followup with you.  I also asked him to email me, verifying that he had  received these lab results.to date I have not heard from [place].   Would you be kind enough to schedule a followup with Roe Coombs as soon as  possible?  His home phone, at which I have left the messages, is [phone].    Sincerely,      Titus Dubin. [name], MD,FACP,FCCP  Electronically Signed    WFH/MedQ  DD: 04/14/2007  DT: 04/14/2007  Job #: [id number]

## 2011-03-12 NOTE — Cardiovascular Report (Signed)
[place], CAHALAN                ACCOUNT NO.:  [id number]   MEDICAL RECORD NO.:  [id number]          PATIENT TYPE:  INP   LOCATION:  2006                         FACILITY:  [facility]   PHYSICIAN:  Everardo Beals. 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  He developed chest pain two days ago and was seen  by [name] in his office.  His ECG had shown some T-wave flattening  in the anterior leads compared to his previous ECGs and he was admitted  to the hospital with diagnosis of possible unstable angina.  He  subsequently underwent rest stress exercise Myoview scan which suggested  an apical reversible defect.  For this reason he is scheduled for  catheterization today.  His risk factors include excess weight, elevated  cholesterol, and positive family history for coronary heart disease.   PROCEDURE:  The procedure was followed to the right femoral artery and  arterial sheath and 6-French preformed coronary catheters.  A front wall  arterial puncture was performed and Omnipaque contrast was used.  A  distal aortogram was performed to rule out renal artery stenosis and  aortoiliac disease.  The right femoral artery was closed with Angio-Seal  at the end of the procedure.  The patient tolerated the procedure well  and left the laboratory in satisfactory condition.   RESULTS:  The left main coronary:  The left main, the short vessel is  free of disease.   Left anterior descending artery:  The left anterior descending artery  gave rise to three septal perforators and two diagonal branches.  The  LAD became somewhat smaller in caliber distally but there was no  significant obstruction and no obvious plaque.   The circumflex artery:  The circumflex artery gave rise to a ramus  branch, a  marginal branch, and two posterolateral branches.  These  vessels appear to be free of disease.   The right coronary artery:  The right coronary artery was a large  caliber vessel that gave rise to two right ventricular branches, a  posterior descending branch, and three posterolateral branches.  The  right coronary artery was free of significant obstruction and there was  no obvious plaque.  The slowdown of the right coronary was somewhat slow  and was created TIMI-2 taking four frames to fill.   The left ventricular artery:  The left ventricular artery projection  showed good wall motion with no areas of hypokinesis.  Estimated  ejection fraction was 60%.   DISTAL AORTOGRAM:  Distal aortogram was performed which showed patent  renal arteries and no significant aortoiliac obstruction.      [name], MD, [facility]  Electronically Signed     BRB/MEDQ  D:  02/15/2007  T:  02/16/2007  Job:  [id number]   cc:   Titus Dubin. [name], MD,FACP,FCCP  Everardo Beals Juanda Chance, MD, [facility]  [facility]
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
## 2011-03-12 NOTE — Discharge Summary (Signed)
[place], POSTLEWAITE                ACCOUNT NO.:  [id number]   MEDICAL RECORD NO.:  [id number]          PATIENT TYPE:  INP   LOCATION:  2006                         FACILITY:  [facility]   PHYSICIAN:  Everardo Beals. Juanda Chance, MD, FACCDATE OF BIRTH:  1951-04-25   DATE OF ADMISSION:  02/13/2007  DATE OF DISCHARGE:  02/16/2007                               DISCHARGE SUMMARY   PRIMARY CARE Sharnell Knight:  Titus Dubin. [name], MD,FACP,FCCP   PRIMARY CARDIOLOGIST:  Everardo Beals Juanda Chance, MD, [facility]   PRINCIPAL DIAGNOSIS:  Chest pain.   SECONDARY DIAGNOSES:  1. Depression.  2. Asthma.  3. Hyperlipidemia.   ALLERGIES:  NO KNOWN DRUG ALLERGIES.   PROCEDURES:  Left heart cardiac catheterization.   HISTORY OF PRESENT ILLNESS:  A 60 year old Caucasian male with no prior  history of CAD who was in his usual state of health until April 21, when  he developed substernal chest discomfort in the setting of increased  emotional stress and work load at work, and he was seen by [name] as  an outpatient.  ECG was obtained showing some T waves flattening in  anterior leads, which was new from an ECG in 2007, and he was  subsequently transferred to [facility] for admission and evaluation.   HOSPITAL COURSE:  The patient ruled out for MI by cardiac markers x2.  His D-dimer was normal at 0.42.  Decision was made to perform an  exercise Myoview, which took place on April 22.  The patient exercised  for a total of 13 minutes attaining a maximum heart rate of 144 beats  per minute.  He had no ST or T changes.  Imaging revealed evidence of  probable ischemia in the apex as well as possibly the inferior wall.  LV  function was normal with mild inferior basilar hypokinesis secondary to  his abnormal Myoview imaging.  Decision was made to perform  catheterization, which was performed on April 23 and showed normal  coronary arteries with an EF of 60%.  Mr. Matsuo has had no additional  chest discomfort.  He has been ambulating  this morning without  difficulty or limitations.  He is being discharged home today in  satisfactory condition.   DISCHARGE LABORATORY:  Hemoglobin 15.9, hematocrit 46.4, WBC 7.0,  platelets 204.  MCV 89.5.  Sodium 137, potassium 3.9, chloride 104, CO2  of 22, BUN 16, creatinine 1.03, glucose 116, PT 13.2, INR 1.0, total  cholesterol 212, triglycerides 144, HDL 39, LDL 144, cardiac markers  negative x2, calcium 9.2, D-dimer 0.42.   DISPOSITION:  The patient is being discharged home today in good  condition.   FOLLOWUP PLANS AND APPOINTMENTS:  He is asked to follow up with Dr.  [name] as previously scheduled and with Dr. Juanda Chance as necessary.   DISCHARGE MEDICATIONS:  Seroquel 200 mg q.h.s.   OUTSTANDING LABORATORY STUDIES:  None.   DURATION OF DISCHARGE ENCOUNTER:  32 minutes including physician time.      Nicolasa Ducking, ANP      Bruce R. Juanda Chance, MD, [facility]  Electronically Signed    CB/MEDQ  D:  02/16/2007  T:  02/16/2007  Job:  [id number]   cc:   Titus Dubin. [name], MD,FACP,FCCP

## 2011-03-12 NOTE — Assessment & Plan Note (Signed)
[facility]ALTHC[facility]                                 ON-CALL NOTE   NAME:DIGBYDarcy, Cordner                         MRN:          [id number]  DATE:02/13/2007                            DOB:          1951-08-28    Phone call comes from Dr. Nolen Mu at [phone], patient of [name].  Phone call came at 5:13 p.m. on April 21.   Dr. Nolen Mu sent Dr. Hazle Quant for evaluation by [name] and was trying  to reach him to discuss that, got the answering service because she  called back after 5:00.   PLAN:  I called the back line, got a hold of [name] and gave him her  number to call.     Karie Schwalbe, MD  Electronically Signed    RIL/MedQ  DD: 02/13/2007  DT: 02/14/2007  Job #: [id number]   cc:   Titus Dubin. [name], MD,FACP,FCCP

## 2011-04-02 ENCOUNTER — Other Ambulatory Visit: Payer: Self-pay | Admitting: Internal Medicine

## 2011-04-02 MED ORDER — TADALAFIL 20 MG PO TABS: 20.0000 mg | ORAL_TABLET | Freq: Every day | ORAL | Status: DC | PRN

## 2011-04-02 NOTE — Telephone Encounter (Signed)
RX sent to pharmacy  

## 2011-04-06 ENCOUNTER — Other Ambulatory Visit: Payer: Self-pay

## 2011-04-06 MED ORDER — TADALAFIL 20 MG PO TABS: 20.0000 mg | ORAL_TABLET | Freq: Every day | ORAL | Status: DC | PRN

## 2011-04-09 NOTE — Telephone Encounter (Signed)
Please refax rx to [facility] [phone] for Cialis rx. They have not received this.

## 2011-04-26 ENCOUNTER — Encounter: Payer: Self-pay | Admitting: Cardiology

## 2011-06-09 ENCOUNTER — Telehealth: Payer: Self-pay | Admitting: Internal Medicine

## 2011-06-09 NOTE — Telephone Encounter (Signed)
LMOM that he has appt with Dr [name] at 7:30am on Fri 8/17--asked him to call and confirm that he received msg

## 2011-06-09 NOTE — Telephone Encounter (Signed)
Patient called and confirmed appt for Friday at 7:30

## 2011-06-09 NOTE — Telephone Encounter (Signed)
Per Dr.Hopper patient can be seen at 7:30 on Friday, patient will need to be here promptly @ that time Dr.Hopper will discuss when CPX can be done

## 2011-06-09 NOTE — Telephone Encounter (Signed)
schedule is full---what would you like me to do regarding this followup appointment??   Also says he needs CPX--eaither early morning or late afternoon---do you want me to offer one of the 4:67m physicals or find him a early morning in October??

## 2011-06-11 ENCOUNTER — Encounter: Payer: Self-pay | Admitting: Internal Medicine

## 2011-06-11 ENCOUNTER — Ambulatory Visit (INDEPENDENT_AMBULATORY_CARE_PROVIDER_SITE_OTHER): Payer: BC Managed Care – PPO | Admitting: Internal Medicine

## 2011-06-11 DIAGNOSIS — I10 Essential (primary) hypertension: Secondary | ICD-10-CM

## 2011-06-11 DIAGNOSIS — E785 Hyperlipidemia, unspecified: Secondary | ICD-10-CM

## 2011-06-11 MED ORDER — TADALAFIL 20 MG PO TABS: 20.0000 mg | ORAL_TABLET | ORAL | Status: DC

## 2011-06-11 NOTE — Progress Notes (Signed)
  Subjective:    Patient ID: [name], male    DOB: 11/12/1950, 59 y.o.   MRN: [id number]  HPI #1 HYPERTENSION: Disease Monitoring: Blood pressure range-130/80 @ home   Chest pain, palpitations- no       Dyspnea- no; asthma flare slightly recently ? From environmental  temperature changes Medications: Compliance- yes  Lightheadedness,Syncope- some postural symptoms due to heat    Edema- stable  #2 HYPERLIPIDEMIA: Disease Monitoring: See symptoms for Hypertension Medications: Compliance- off Pravastatin X 2 months ; nutritional changes made. He questioned memory issues , fatigue & muscle symptoms.            Review of Systems Abd pain, bowel changes- no   Muscle aches- not off statin, some arthralgias      Objective:   Physical Exam Gen.: Healthy and well-nourished in appearance. Alert, appropriate and cooperative throughout exam.  Neck: No deformities, masses, or tenderness noted.  Thyroid normal. Lungs: Normal respiratory effort; chest expands symmetrically. Lungs are clear to auscultation without rales, wheezes, or increased work of breathing. Heart: Slow rate and regular  rhythm. Normal S1 and S2. No gallop, click, or rub. No  murmur. Abdomen: Bowel sounds normal; abdomen soft and nontender. No masses, organomegaly or hernias noted. No AAA or bruits                                                                                Musculoskeletal/extremities:  No clubbing, cyanosis, edema, or deformity noted. Range of motion  normal .Tone & strength  normal.Joints normal. Nail health  good. Vascular: Carotid, radial artery, dorsalis pedis and  posterior tibial pulses are full and equal. No bruits present. Neurologic: Alert and oriented x3. Deep tendon reflexes symmetrical and normal.         Psych: Mood and affect are normal. Normally interactive                                                                                         Assessment & Plan:  #1  hypertension, well controlled  #2 dyslipidemia, now off statin and on nutritional program. LDL goal based on advanced testing is less than 100.  Plan: renew  blood pressure meds for one year.  Fasting lipids should be checked at his convenience.

## 2011-06-11 NOTE — Patient Instructions (Signed)
Your BP goal = AVERAGE < 135/85. Avoid ingestion of  excess salt/sodium.Cook with pepper & other spices . Use the salt substitute "No Salt"(unless your potassium has been elevated) OR the Mrs Sharilyn Sites products to season food @ the table. Avoid foods which taste salty or "vinegary" as their sodium contentet will be high.  Please  schedule fasting Labs : BMET,Lipids, hepatic panel, CBC & dif, TSH.(272.4, 401.9)

## 2011-07-06 ENCOUNTER — Other Ambulatory Visit: Payer: Self-pay | Admitting: Internal Medicine

## 2011-07-06 MED ORDER — VERAPAMIL HCL ER 120 MG PO CP24: 120.0000 mg | ORAL_CAPSULE | Freq: Every day | ORAL | Status: DC

## 2011-07-06 NOTE — Telephone Encounter (Signed)
RX sent

## 2011-08-09 ENCOUNTER — Other Ambulatory Visit: Payer: Self-pay | Admitting: Internal Medicine

## 2011-09-14 ENCOUNTER — Telehealth: Payer: Self-pay

## 2011-09-14 MED ORDER — CEFUROXIME AXETIL 500 MG PO TABS: 500.0000 mg | ORAL_TABLET | Freq: Two times a day (BID) | ORAL | Status: DC

## 2011-09-14 NOTE — Telephone Encounter (Signed)
RX sent

## 2012-02-14 ENCOUNTER — Other Ambulatory Visit: Payer: Self-pay | Admitting: Internal Medicine

## 2012-02-17 ENCOUNTER — Encounter: Payer: Self-pay | Admitting: Internal Medicine

## 2012-02-17 ENCOUNTER — Ambulatory Visit (INDEPENDENT_AMBULATORY_CARE_PROVIDER_SITE_OTHER): Payer: 59 | Admitting: Internal Medicine

## 2012-02-17 VITALS — BP 128/86 | HR 79 | Temp 98.1°F | Wt 319.0 lb

## 2012-02-17 DIAGNOSIS — I10 Essential (primary) hypertension: Secondary | ICD-10-CM

## 2012-02-17 DIAGNOSIS — R002 Palpitations: Secondary | ICD-10-CM

## 2012-02-17 DIAGNOSIS — E785 Hyperlipidemia, unspecified: Secondary | ICD-10-CM

## 2012-02-17 NOTE — Patient Instructions (Signed)
To prevent palpitations or premature beats, avoid stimulants such as decongestants, diet pills, nicotine, or caffeine (coffee, tea, cola, or chocolate) to excess.  Please  schedule fasting Labs : BMET,Lipids, hepatic panel, CBC & dif, TSH, free T4. PLEASE BRING THESE INSTRUCTIONS TO FOLLOW UP  LAB APPOINTMENT.This will guarantee correct labs are drawn, eliminating need for repeat blood sampling ( needle sticks ! ). Diagnoses /Codes: 272.4,401.9, palpitations  Please try to go on My Chart within  24 hours of blood draw to allow me to release the results directly to you.

## 2012-02-17 NOTE — Progress Notes (Signed)
  Subjective:    Patient ID: [name], male    DOB: Jun 15, 1951, 61 y.o.   MRN: [id number]  HPI He started an exercise program to 3 weeks ago. After exercising his wife noted that his pulse was slightly irregular. He was unaware of any rhythm changes.  He has not noticed any symptoms with exercise. Specifically he denies dyspnea on exertion, sensed palpitations, claudication, paroxysmal nocturnal dyspnea or significant  Edema  Past medical history/family history/social history were all reviewed and updated. Pertinent data: There is no family history of dysrhythmias. We will have 1-2 cups of coffee a day and a small square chocolate at night.  Cardiac catheterization was negative in 2008 for noncardiac chest and arm pain  There is a family history of coronary disease but not premature  He was evaluated by sleep specialists several years ago and felt to have mild sleep apnea which did not require intervention  He has been exercising 30 minutes on reclining bike 5-6 times per week    Review of Systems His asthma has been quiescent; he is not using a metered-dose inhaler on regular basis. His wife does not describe apnea but she does note snoring  He's been off of statin for several years. He has improved his nutritional program with significant weight loss of 18 pounds.     Objective:   Physical Exam Gen.:  well-nourished; in no acute distress Eyes: Extraocular motion intact; no lid lag or proptosis Neck: thyroid normal Heart: Normal rhythm and rate without significant murmur, gallop. Soft S4 Lungs: Chest clear to auscultation without rales,rales, wheezes  Abdomen: No organomegaly or masses. Aorta not palpable Neuro:Deep tendon reflexes are equal and within normal limits; no tremor Vascular: All pulses intact without bruits Extremities: No clubbing or cyanosis. Trace edema at the sock line  Skin: Warm and dry without significant lesions or rashes; no onycholysis Psych: Normally  communicative and interactive; no abnormal mood or affect clinically.         Assessment & Plan:   #1 palpitations; not sensed and subjectively not increased with exercise. EKG compared to 08/21/10 suggests some loss of the voltage in V3 leads. There is a question of a Q wave in lead 3 and aVF versus interventricular conduction delay. There are no associated ischemic changes in leads 3 and aVF. One PAC and one PVC are noted.  #2 hypertension, good control  #3 dyslipidemia; followup lipids indicated  Plan: Fasting labs should be performed. Followup with Dr. Bonnee Quin would be recommended

## 2012-04-19 ENCOUNTER — Ambulatory Visit (INDEPENDENT_AMBULATORY_CARE_PROVIDER_SITE_OTHER): Payer: 59 | Admitting: Cardiology

## 2012-04-19 ENCOUNTER — Encounter: Payer: Self-pay | Admitting: Cardiology

## 2012-04-19 VITALS — BP 116/74 | HR 74 | Ht 75.0 in | Wt 301.0 lb

## 2012-04-19 DIAGNOSIS — I10 Essential (primary) hypertension: Secondary | ICD-10-CM

## 2012-04-19 DIAGNOSIS — E785 Hyperlipidemia, unspecified: Secondary | ICD-10-CM

## 2012-04-19 DIAGNOSIS — R002 Palpitations: Secondary | ICD-10-CM

## 2012-04-19 NOTE — Progress Notes (Signed)
HPI:  The patient is seen at the request of [name]. He had noted some palpitations while exercising at his wife had noted. He's otherwise been asymptomatic. He exercises on a fairly regular basis and has no significant limitations. Patient previously undergone cardiac catheterization by Dr. Juanda Chance, and had no significant coronary abnormalities. He does notice that he gets a little bit orthostatic from time to time and we discussed this in some detail today. He denies any significant chest pain. In questioning him about the palpitations, there is no suggestion that his pulse was irregularly irregular. There were occasional skips according to the patient. In reviewing his chart, we noted that he has not had labs done in nearly 2 years. We discussed this as well. Current Outpatient Prescriptions  Medication Sig Dispense Refill  . aspirin (BAYER LOW STRENGTH) 81 MG EC tablet Take 81 mg by mouth daily.        Marland Kitchen azelastine (ASTELIN) 137 MCG/SPRAY nasal spray Place 1 spray into the nose as directed. Use in each nostril as directed       . MICARDIS HCT 80-12.5 MG per tablet TAKE 1 TABLET EACH DAY.  30 each  5  . Multiple Vitamins-Minerals (MACULAR VITAMIN BENEFIT PO) Take by mouth daily.        . Omega-3 Fatty Acids (FISH OIL) 1000 MG CAPS Take by mouth daily.        . tadalafil (CIALIS) 20 MG tablet Take 1 tablet (20 mg total) by mouth as directed. 1 every 3 days as needed  10 tablet  3  . verapamil (VERELAN PM) 120 MG 24 hr capsule Take 1 capsule (120 mg total) by mouth daily.  90 capsule  3  . DISCONTD: Verapamil HCl CR 200 MG CP24 Take by mouth daily.          Allergies  Allergen Reactions  . Metoprolol Succinate     REACTION: temp intolerance, cough  . Fluticasone-Salmeterol     GERD symptoms    Past Medical History  Diagnosis Date  . Non-cardiac chest pain 2008    and arm pain; Dr. Charlies Constable.  Marland Kitchen HLD (hyperlipidemia)     NMR 2006: LDL 206(2954/1907), HDL 43, TG 82. LDL goal = <100.  [place] study LDL goal =  < 130    Past Surgical History  Procedure Date  . I&d l knee 2009    for post traumatic cellulitis/abscess  . Tonsillectomy   . Cardiac catheterization 2008    negative    Family History  Problem Relation Age of Onset  . Dementia Father   . Pancreatic cancer Father   . Prostate cancer Father   . Glaucoma Father   . Heart attack Maternal Uncle     in 77s  . Stroke Maternal Grandfather   . Heart attack Maternal Grandfather     > 55  . Coronary artery disease Other     maternal FH CAD  . Heart attack Cousin     in 46s    History   Social History  . Marital Status: Married    Spouse Name: N/A    Number of Children: N/A  . Years of Education: N/A   Occupational History  . Not on file.   Social History Main Topics  . Smoking status: Never Smoker   . Smokeless tobacco: Not on file  . Alcohol Use: 5.4 oz/week    9 Glasses of wine per week     socially  . Drug  Use:   . Sexually Active:    Other Topics Concern  . Not on file   Social History Narrative   Regularly exercises- until recent family issues. Occupation: Clinical research associate. Married.     ROS: Please see the HPI.  All other systems reviewed and negative.  PHYSICAL EXAM:  BP 116/74  Pulse 74  Ht 6\' 3"  (1.905 m)  Wt 301 lb (136.533 kg)  BMI 37.62 kg/m2  General: Well developed, well nourished, in no acute distress. Head:  Normocephalic and atraumatic. Neck: no JVD Lungs: Clear to auscultation and percussion. Heart: Normal S1 and S2.  No murmur, rubs or gallops.  Abdomen:  Normal bowel sounds; soft; non tender; no organomegaly Pulses: Pulses normal in all 4 extremities. Extremities: No clubbing or cyanosis. No edema. Neurologic: Alert and oriented x 3.  EKG:  No change from prior tracing.  NSR with one PVC.  Nonspecific IVCD as previously noted.  ECHO:  02/15/2007 SUMMARY - Overall left ventricular systolic function was normal. Left ventricular ejection fraction  was estimated to be 65 %. There were no left ventricular regional wall motion abnormalities. Left ventricular wall thickness was mildly increased. There was moderate focal basal septal hypertrophy. There was end-systolic left ventricular, mid-cavity obliteration without any significant LVOT gradient. - The left atrium was mild to moderately dilated. - The right atrium was mildly dilated.  CATH  01/2007 RESULTS: The left main coronary: The left main, the short vessel is  free of disease.  Left anterior descending artery: The left anterior descending artery  gave rise to three septal perforators and two diagonal branches. The  LAD became somewhat smaller in caliber distally but there was no  significant obstruction and no obvious plaque.  The circumflex artery: The circumflex artery gave rise to a ramus  branch, a marginal branch, and two posterolateral branches. These  vessels appear to be free of disease.  The right coronary artery: The right coronary artery was a large  caliber vessel that gave rise to two right ventricular branches, a  posterior descending branch, and three posterolateral branches. The  right coronary artery was free of significant obstruction and there was  no obvious plaque. The slowdown of the right coronary was somewhat slow  and was created TIMI-2 taking four frames to fill.  The left ventricular artery: The left ventricular artery projection  showed good wall motion with no areas of hypokinesis. Estimated  ejection fraction was 60%.  DISTAL AORTOGRAM: Distal aortogram was performed which showed patent  renal arteries and no significant aortoiliac obstruction.  [name], MD, [facility]  Electronically Signed  BRB/MEDQ D: 02/15/2007 T: 02/16/2007 Job: [id number]     ASSESSMENT AND PLAN:

## 2012-04-19 NOTE — Assessment & Plan Note (Signed)
His blood pressure is been well-controlled not significantly elevated. He does notice that at times he gets a little orthostatic when he stands up. Given his use of diuretic, I've encouraged the patient to try to remain well-hydrated. I have recommended that we do some electrolytes. His previous studies is suggested mild volume contraction with elevated BUN. I've also suggest the possibility of giving himself a trial of discontinuation of the verapamil. His blood pressures well controlled without this, it would make sense to stop this given the number of drug interactions associated with the use of a calcium channel antagonist. He will check this, and give me a call if his blood pressure continues to rise so we can think of some other alternative to the current use of this medication. Have given him my cell phone directly.

## 2012-04-19 NOTE — Patient Instructions (Signed)
Your physician recommends that you return for lab work: BMP, Magnesium, LIPID, LIVER, CBC, TSH, Free T4

## 2012-04-19 NOTE — Assessment & Plan Note (Signed)
He has noted them on this occasion as noted.  It has not been disabling.  His LV function has been normal and he has not had significant CAD, nor is he symptomatic.  Given his diuretic, I would repeat a BMET and also MG to make sure there are not electrolyte abnormalities.  They had been ordered but he did not get them checked in the recent past, and Dr Caryl Never order has expired, so we will repeat.

## 2012-07-13 ENCOUNTER — Other Ambulatory Visit: Payer: Self-pay | Admitting: Otolaryngology

## 2012-07-13 DIAGNOSIS — G522 Disorders of vagus nerve: Secondary | ICD-10-CM

## 2012-07-13 DIAGNOSIS — J3800 Paralysis of vocal cords and larynx, unspecified: Secondary | ICD-10-CM

## 2012-07-14 ENCOUNTER — Ambulatory Visit
Admission: RE | Admit: 2012-07-14 | Discharge: 2012-07-14 | Disposition: A | Discharge status: Home / Self Care | Payer: 59 | Source: Ambulatory Visit | Attending: Otolaryngology | Admitting: Otolaryngology

## 2012-07-14 DIAGNOSIS — G522 Disorders of vagus nerve: Secondary | ICD-10-CM

## 2012-07-14 MED ORDER — IOHEXOL 300 MG/ML  SOLN
75.0000 mL | Freq: Once | INTRAMUSCULAR | Status: AC | PRN
  Administered 2012-07-14: 75 mL via INTRAVENOUS

## 2012-08-21 ENCOUNTER — Other Ambulatory Visit: Payer: Self-pay | Admitting: Internal Medicine

## 2012-11-17 ENCOUNTER — Telehealth: Payer: Self-pay | Admitting: Internal Medicine

## 2012-11-17 DIAGNOSIS — Z0000 Encounter for general adult medical examination without abnormal findings: Secondary | ICD-10-CM

## 2012-11-17 NOTE — Telephone Encounter (Signed)
Please  schedule fasting Labs : BMET,Lipids, hepatic panel, CBC & dif, TSH, PSA. Code: V70.0

## 2012-11-17 NOTE — Telephone Encounter (Signed)
pt called to scedule cpe due to his schedule I put him in 3.7.14 at 4pm--pt would like to have his labs doen on 1.28.14 and he noted especially his PSA Please let me know if his appointment is not acceptable and I will call him back. Please review and put in lab orders for 1.28.14 to be drawn here, if appropriate thanks

## 2012-11-17 NOTE — Telephone Encounter (Signed)
Orders placed.

## 2012-11-21 ENCOUNTER — Other Ambulatory Visit (INDEPENDENT_AMBULATORY_CARE_PROVIDER_SITE_OTHER): Payer: 59

## 2012-11-21 DIAGNOSIS — Z0000 Encounter for general adult medical examination without abnormal findings: Secondary | ICD-10-CM

## 2012-11-21 LAB — HEMOGLOBIN A1C: Hgb A1c MFr Bld: 4.8 % (ref 4.6–6.5)

## 2012-11-21 LAB — HEPATIC FUNCTION PANEL
AST: 20 U/L (ref 0–37)
Alkaline Phosphatase: 53 U/L (ref 39–117)
Total Bilirubin: 1.2 mg/dL (ref 0.3–1.2)

## 2012-11-21 LAB — CBC WITH DIFFERENTIAL/PLATELET
Basophils Absolute: 0.1 10*3/uL (ref 0.0–0.1)
Eosinophils Relative: 5.3 % — ABNORMAL HIGH (ref 0.0–5.0)
Hemoglobin: 15.6 g/dL (ref 13.0–17.0)
Lymphocytes Relative: 24.3 % (ref 12.0–46.0)
Monocytes Relative: 7.8 % (ref 3.0–12.0)
Neutro Abs: 4.2 10*3/uL (ref 1.4–7.7)
Platelets: 194 10*3/uL (ref 150.0–400.0)
RDW: 12.6 % (ref 11.5–14.6)
WBC: 6.7 10*3/uL (ref 4.5–10.5)

## 2012-11-21 LAB — LIPID PANEL
Total CHOL/HDL Ratio: 4
VLDL: 22.2 mg/dL (ref 0.0–40.0)

## 2012-11-21 LAB — LDL CHOLESTEROL, DIRECT: Direct LDL: 143.4 mg/dL

## 2012-11-21 LAB — PSA: PSA: 0.58 ng/mL (ref 0.10–4.00)

## 2012-11-21 LAB — BASIC METABOLIC PANEL
Chloride: 105 mEq/L (ref 96–112)
Creatinine, Ser: 1.1 mg/dL (ref 0.4–1.5)
Sodium: 138 mEq/L (ref 135–145)

## 2012-11-21 LAB — TSH: TSH: 2.25 u[IU]/mL (ref 0.35–5.50)

## 2012-12-05 ENCOUNTER — Other Ambulatory Visit: Payer: Self-pay | Admitting: Internal Medicine

## 2012-12-05 ENCOUNTER — Telehealth: Payer: Self-pay | Admitting: Internal Medicine

## 2012-12-05 DIAGNOSIS — J0110 Acute frontal sinusitis, unspecified: Secondary | ICD-10-CM

## 2012-12-05 MED ORDER — CEFUROXIME AXETIL 500 MG PO TABS: 500.0000 mg | ORAL_TABLET | Freq: Two times a day (BID) | ORAL | Status: DC

## 2012-12-05 MED ORDER — FLUTICASONE PROPIONATE 50 MCG/ACT NA SUSP: NASAL | Status: DC

## 2012-12-05 NOTE — Telephone Encounter (Signed)
Patient called stating he has had a cold x 1 week with cough, low-grade fever, and thick colored mucous. Patient was evaluated by [name] via phone and antibiotic was called into [facility].

## 2012-12-13 ENCOUNTER — Ambulatory Visit (INDEPENDENT_AMBULATORY_CARE_PROVIDER_SITE_OTHER): Payer: 59 | Admitting: Internal Medicine

## 2012-12-13 ENCOUNTER — Encounter: Payer: Self-pay | Admitting: Internal Medicine

## 2012-12-13 VITALS — BP 122/74 | HR 64 | Temp 98.6°F | Resp 12 | Ht 74.03 in | Wt 296.8 lb

## 2012-12-13 DIAGNOSIS — Z23 Encounter for immunization: Secondary | ICD-10-CM

## 2012-12-13 DIAGNOSIS — Z0000 Encounter for general adult medical examination without abnormal findings: Secondary | ICD-10-CM

## 2012-12-13 MED ORDER — TELMISARTAN 80 MG PO TABS: ORAL_TABLET | ORAL | Status: DC

## 2012-12-13 MED ORDER — TADALAFIL 20 MG PO TABS: 20.0000 mg | ORAL_TABLET | ORAL | Status: DC | PRN

## 2012-12-13 MED ORDER — ROSUVASTATIN CALCIUM 20 MG PO TABS: ORAL_TABLET | ORAL | Status: DC

## 2012-12-13 NOTE — Patient Instructions (Addendum)
Preventive Health Care: Exercise at least 30-45 minutes a day,  3-4 days a week.  Eat a low-fat diet with lots of fruits and vegetables, up to 7-9 servings per day. This would eliminate the need for vitamin supplements. Consume less than 40 grams of sugar (preferably ZERO) per day from foods & drinks with High Fructose Corn Sugar as #1,2,3 or # 4 on label. To prevent PVCs, avoid stimulants such as decongestants, diet pills, nicotine, or caffeine (coffee, tea, cola, or chocolate) to excess.  If you activate the  My Chart system; lab & Xray results will be released directly  to you as soon as I review & address these through the computer. As per [place] all records must be reviewed and signed off within 72 hours; but I attempt to complete this within 36 hours unless I have no access to the electronic medical record.If I wait more than 36 hours the volume of reports becomes difficult to manage optimally. In my absence ;my partners would address the results.Critical lab results will be communicated to you ASAP. If you choose not to sign up for My Chart within 36 hours of labs being drawn; results will be reviewed & interpretation added before being copied & mailed, causing a delay in getting the results to you.If you do not receive that report within 7-10 days ,please call. Additionally you can use this system to gain direct  access to your records  if  out of town or @ an office of a  physician who is not in  the My Chart network.  This improves continuity of care & places you in control of your medical record.   Please  schedule fasting Labs in 10 weeks after Crestor started: CK,Lipids, hepatic panel. PLEASE BRING THESE INSTRUCTIONS TO FOLLOW UP  LAB APPOINTMENT.This will guarantee correct labs are drawn, eliminating need for repeat blood sampling ( needle sticks ! ). Diagnoses /Codes: 272.4,995.20.

## 2012-12-13 NOTE — Progress Notes (Signed)
  Subjective:    Patient ID: [name], male    DOB: 11/01/1950, 62 y.o.   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     Review of Systems  He is on a heart healthy diet; he exercises 30-45  minutes 3 times per week as walking without symptoms. Specifically he denies chest pain, palpitations, dyspnea, or claudication. Family history is negative for premature coronary disease. Advanced cholesterol testing reveals his LDL goal was less than 100; ideally , 70. Lp(a) was 87.      Objective:   Physical Exam Gen.:  Healthy and well-nourished in appearance. Alert, appropriate and cooperative throughout exam. Appears younger than stated age  Head: Normocephalic without obvious abnormalities Eyes: No corneal or conjunctival inflammation noted. Pupils equal round reactive to light and accommodation.  Extraocular motion intact. Vision grossly normal with lenses Ears: External  ear exam reveals no significant lesions or deformities. Canals clear .TMs normal. Hearing is grossly normal bilaterally. Nose: External nasal exam reveals no deformity or inflammation. Nasal mucosa are pink and moist. No lesions or exudates noted. Septum to R  Mouth: Oral mucosa and oropharynx reveal no lesions or exudates. Teeth in good repair. Neck: No deformities, masses, or tenderness noted. Range of motion & Thyroid normal. Lungs: Normal respiratory effort; chest expands symmetrically. Lungs are clear to auscultation without rales, wheezes, or increased work of breathing. Heart: Normal rate and rhythm. Normal S1 and S2. No gallop, click, or rub. No murmur. Abdomen: Bowel sounds normal; abdomen soft and nontender. No masses, organomegaly or hernias noted. Genitalia: Genitalia normal except for right epididymal granuloma & left varices. Prostate is normal without enlargement, asymmetry, nodularity, or induration.                              Musculoskeletal/extremities: No deformity or  scoliosis noted of  the thoracic or lumbar spine.  No clubbing, cyanosis, edema, or significant extremity  deformity noted. Range of motion normal .Tone & strength normal. Joints normal. Nail health good. Able to lie down & sit up w/o help. Negative SLR bilaterally Vascular: Carotid, radial artery, dorsalis pedis and  posterior tibial pulses are full and equal. No bruits present. Neurologic: Alert and oriented x3. Deep tendon reflexes symmetrical and 1/2+.  Gait normal  including heel & toe walking .        Skin: Intact without suspicious lesions or rashes. Lymph: No cervical, axillary, or inguinal lymphadenopathy present. Psych: Mood and affect are normal. Normally interactive                                                                                        Assessment & Plan:  #1 comprehensive physical exam; no acute findings  Plan: see Orders  & Recommendations

## 2012-12-20 ENCOUNTER — Ambulatory Visit
Admission: RE | Admit: 2012-12-20 | Discharge: 2012-12-20 | Disposition: A | Discharge status: Home / Self Care | Payer: 59 | Source: Ambulatory Visit | Attending: Urology | Admitting: Urology

## 2012-12-20 ENCOUNTER — Other Ambulatory Visit: Payer: Self-pay | Admitting: Urology

## 2012-12-20 DIAGNOSIS — N50819 Testicular pain, unspecified: Secondary | ICD-10-CM

## 2012-12-22 ENCOUNTER — Other Ambulatory Visit: Payer: 59

## 2012-12-25 ENCOUNTER — Other Ambulatory Visit: Payer: 59

## 2012-12-29 ENCOUNTER — Encounter: Payer: 59 | Admitting: Internal Medicine

## 2013-04-20 ENCOUNTER — Telehealth: Payer: Self-pay | Admitting: *Deleted

## 2013-04-20 NOTE — Telephone Encounter (Signed)
Received call from patient stating that he had been using Crestor 10mg  samples and was now out of his medication. Pt. Requesting a refill on his Crestor sent to [facility]. Last office visit 12/13/12 with [name]. Last rx filled was also on 12/13/12 for Crestor 20mg  on Monday, Wednesday, Friday #35 Tab no refills. Noted from prior visit that pt. Is overdue for a lipid check. Please advise if it is ok to refill this medication.

## 2013-04-23 NOTE — Telephone Encounter (Signed)
Samples given to patient by [name].

## 2013-08-23 ENCOUNTER — Telehealth: Payer: Self-pay | Admitting: Internal Medicine

## 2013-08-23 NOTE — Telephone Encounter (Signed)
Patient is requesting a refill forg Crestor 10mg . He was given samples and is currently out. Patient uses [facility].

## 2013-08-24 ENCOUNTER — Other Ambulatory Visit: Payer: Self-pay | Admitting: *Deleted

## 2013-08-24 MED ORDER — ROSUVASTATIN CALCIUM 20 MG PO TABS: ORAL_TABLET | ORAL | Status: DC

## 2013-08-24 NOTE — Telephone Encounter (Signed)
Crestor refilled.

## 2013-09-28 ENCOUNTER — Other Ambulatory Visit: Payer: Self-pay | Admitting: Internal Medicine

## 2013-09-28 DIAGNOSIS — J209 Acute bronchitis, unspecified: Secondary | ICD-10-CM

## 2013-09-28 MED ORDER — CEFUROXIME AXETIL 250 MG PO TABS: 250.0000 mg | ORAL_TABLET | Freq: Two times a day (BID) | ORAL | Status: DC

## 2013-09-28 MED ORDER — ALBUTEROL SULFATE HFA 108 (90 BASE) MCG/ACT IN AERS: 2.0000 | INHALATION_SPRAY | RESPIRATORY_TRACT | Status: DC | PRN

## 2013-12-24 ENCOUNTER — Other Ambulatory Visit: Payer: Self-pay | Admitting: Internal Medicine

## 2014-01-21 ENCOUNTER — Other Ambulatory Visit: Payer: Self-pay | Admitting: Internal Medicine

## 2014-02-05 ENCOUNTER — Other Ambulatory Visit: Payer: Self-pay | Admitting: Internal Medicine

## 2014-02-05 MED ORDER — TELMISARTAN 40 MG PO TABS: 40.0000 mg | ORAL_TABLET | Freq: Every day | ORAL | Status: DC

## 2014-02-22 ENCOUNTER — Encounter: Payer: Self-pay | Admitting: Internal Medicine

## 2014-02-22 ENCOUNTER — Ambulatory Visit (INDEPENDENT_AMBULATORY_CARE_PROVIDER_SITE_OTHER): Payer: 59 | Admitting: Internal Medicine

## 2014-02-22 VITALS — BP 144/86 | HR 70 | Temp 98.1°F | Resp 14 | Wt 324.2 lb

## 2014-02-22 DIAGNOSIS — E785 Hyperlipidemia, unspecified: Secondary | ICD-10-CM

## 2014-02-22 DIAGNOSIS — I10 Essential (primary) hypertension: Secondary | ICD-10-CM

## 2014-02-22 NOTE — Progress Notes (Signed)
Pre visit review using our clinic review tool, if applicable. No additional management support is needed unless otherwise documented below in the visit note. 

## 2014-02-22 NOTE — Progress Notes (Signed)
   Subjective:    Patient ID: Anthony ChimesDonald Oliver, male    DOB: 02/17/1951, 63 y.o.   MRN: [id number]  HPI Blood pressure averages 130/70 Compliant with anti hypertemsive medication. No lightheadedness or other adverse medication effect described.  A heart healthy & modified low salt diet is followed. No regular exercise due to work demands & involvement in his sister's care . She is in nursing facility with terminal pulmonary fibrosis. She has lost her health benefits. Family history is negative for CVA.  Labs done 3 /14 by insurance agent; he'll provide for chart inclusion. Lipid assessment due. No adverse effects with statin by history.    Review of Systems  Significant headaches, epistaxis, chest pain, palpitations, exertional dyspnea, claudication, paroxysmal nocturnal dyspnea, or edema absent. Asthma well controlled by history. Increased emotional stress with his sister's illness.       Objective:   Physical Exam Appears healthy and well-nourished & in no acute distress. Weight excess  No carotid bruits are present.No neck pain distention present at 10 - 15 degrees. Thyroid normal to palpation  Heart rhythm and rate are normal with no gallop or murmur. S4  Chest : low grade isolated wheezing; no increased work of breathing  There is no evidence of aortic aneurysm or renal artery bruits.   Protuberant abdomen soft with no organomegaly or masses. No HJR  No clubbing, cyanosis . Trace edema present.  Pedal pulses are intact   No ischemic skin changes are present . Fingernails healthy   Alert and oriented. Strength, tone normal  Mood & affect appropriate & normal          Assessment & Plan:  #1 HTN #2 hyperlipidemia ; status needs update #3 exogenous stress related to his sister's terminal illness & financial hardship; options discussed See orders

## 2014-02-22 NOTE — Patient Instructions (Addendum)
Minimal Blood Pressure Goal= AVERAGE < 140/90;  Ideal is an AVERAGE < 135/85. This AVERAGE should be calculated from @ least 5-7 BP readings taken @ different times of day on different days of week. You should not respond to isolated BP readings , but rather the AVERAGE for that week .Please bring your  blood pressure cuff to office visits to verify that it is reliable.It  can also be checked against the blood pressure device at the pharmacy. Finger or wrist cuffs are not dependable; an arm cuff is.  Cardiovascular exercise, this can be as simple a program as walking, is recommended 30-45 minutes 3-4 times per week. If you're not exercising you should take 6-8 weeks to build up to this level.   

## 2014-02-25 ENCOUNTER — Telehealth: Payer: Self-pay | Admitting: Internal Medicine

## 2014-02-25 NOTE — Telephone Encounter (Signed)
Relevant patient education assigned to patient using Emmi. ° °

## 2014-06-24 ENCOUNTER — Other Ambulatory Visit: Payer: Self-pay | Admitting: Internal Medicine

## 2014-08-07 ENCOUNTER — Ambulatory Visit (INDEPENDENT_AMBULATORY_CARE_PROVIDER_SITE_OTHER): Payer: 59 | Admitting: *Deleted

## 2014-08-07 ENCOUNTER — Telehealth: Payer: Self-pay

## 2014-08-07 DIAGNOSIS — Z23 Encounter for immunization: Secondary | ICD-10-CM

## 2014-08-07 NOTE — Telephone Encounter (Signed)
Per Dr Alwyn RenHopper please schedule patient for a nurse visit flu vaccine

## 2014-08-28 ENCOUNTER — Ambulatory Visit (INDEPENDENT_AMBULATORY_CARE_PROVIDER_SITE_OTHER): Payer: 59

## 2014-08-28 DIAGNOSIS — Z23 Encounter for immunization: Secondary | ICD-10-CM

## 2014-09-13 ENCOUNTER — Encounter: Payer: Self-pay | Admitting: *Deleted

## 2014-09-18 ENCOUNTER — Telehealth (INDEPENDENT_AMBULATORY_CARE_PROVIDER_SITE_OTHER): Payer: 59 | Admitting: Internal Medicine

## 2014-09-18 DIAGNOSIS — Z23 Encounter for immunization: Secondary | ICD-10-CM

## 2014-09-18 NOTE — Telephone Encounter (Signed)
Patient is in lobby.  He is requesting the TDap injection?  He is having a new Grandbaby in December and would like to have the injection.

## 2014-09-18 NOTE — Telephone Encounter (Signed)
Per Dr Alwyn RenHopper okay for patient to have

## 2014-12-25 ENCOUNTER — Encounter: Payer: Self-pay | Admitting: Internal Medicine

## 2014-12-25 ENCOUNTER — Ambulatory Visit (INDEPENDENT_AMBULATORY_CARE_PROVIDER_SITE_OTHER): Payer: 59 | Admitting: Internal Medicine

## 2014-12-25 VITALS — BP 150/90 | HR 64 | Temp 98.8°F | Ht 74.0 in | Wt 328.1 lb

## 2014-12-25 DIAGNOSIS — I10 Essential (primary) hypertension: Secondary | ICD-10-CM

## 2014-12-25 DIAGNOSIS — R9431 Abnormal electrocardiogram [ECG] [EKG]: Secondary | ICD-10-CM

## 2014-12-25 DIAGNOSIS — E785 Hyperlipidemia, unspecified: Secondary | ICD-10-CM

## 2014-12-25 NOTE — Progress Notes (Signed)
   Subjective:    Patient ID: [name], male    DOB: 08/25/1951, 64 y.o.   MRN: [id number]  HPI  He has applied for an insurance policy; the insurance company issuing the policy is concerned about changes in his EKG & has recommended an echocardiogram. He states that labs done @ the time of the insurance physical were all normal. The last labs on record here were 11/21/12. Fasting glucose was 108; but his A1c was 4.8%. LDL was 143.4.  He is on a heart healthy, low-salt diet. He exercises 3 times a week as walking 45 minutes. He has no associated cardio pulmonary symptoms.  He did run out of his Crestor approximally a month ago.   He's been compliant with his angiotensin receptor blocker. Blood pressure at home range 125-130/70-80. He feels his BP is elevated due to the stress of 10 surgeries today.  Review of Systems   Significant headaches, epistaxis, chest pain, palpitations, exertional dyspnea, claudication, paroxysmal nocturnal dyspnea, or edema absent. No GI symptoms , memory loss or myalgias       Objective:   Physical Exam  Appears well-nourished & in no acute distress.BMI 42.11.  No carotid bruits are present.No neck vein distention present at 10 - 15 degrees. Thyroid normal to palpation  Heart rhythm and rate are normal with no gallop or murmur.No premature beats  Chest is clear with no increased work of breathing  There is no evidence of aortic aneurysm or renal artery bruits  Abdomen protuberant but soft with no organomegaly or masses. No HJR  No clubbing, cyanosis or edema present.  Pedal pulses are intact   No ischemic skin changes are present . Fingernails healthy   Alert and oriented. Strength, tone, DTRs reflexes normal       Assessment & Plan:  See Current Assessment & Plan in Problem List under specific Diagnosis

## 2014-12-25 NOTE — Progress Notes (Signed)
Pre visit review using our clinic review tool, if applicable. No additional management support is needed unless otherwise documented below in the visit note. 

## 2014-12-25 NOTE — Assessment & Plan Note (Signed)
NMR Lipoprofile  

## 2014-12-25 NOTE — Patient Instructions (Signed)

## 2014-12-25 NOTE — Assessment & Plan Note (Addendum)
Blood pressure goals reviewed. BMET from insurance exam to be reviewed 2D ECHO

## 2015-01-01 ENCOUNTER — Ambulatory Visit (HOSPITAL_COMMUNITY): Payer: 59 | Attending: Cardiology

## 2015-01-01 DIAGNOSIS — I10 Essential (primary) hypertension: Secondary | ICD-10-CM

## 2015-01-01 DIAGNOSIS — R9431 Abnormal electrocardiogram [ECG] [EKG]: Secondary | ICD-10-CM | POA: Diagnosis present

## 2015-01-01 NOTE — Progress Notes (Signed)
2D Echo completed. 01/01/2015 

## 2015-01-05 ENCOUNTER — Other Ambulatory Visit: Payer: Self-pay | Admitting: Internal Medicine

## 2015-01-28 ENCOUNTER — Other Ambulatory Visit (INDEPENDENT_AMBULATORY_CARE_PROVIDER_SITE_OTHER): Payer: 59

## 2015-01-28 ENCOUNTER — Other Ambulatory Visit: Payer: Self-pay | Admitting: Internal Medicine

## 2015-01-28 DIAGNOSIS — I10 Essential (primary) hypertension: Secondary | ICD-10-CM

## 2015-01-28 DIAGNOSIS — E785 Hyperlipidemia, unspecified: Secondary | ICD-10-CM

## 2015-01-28 LAB — HEPATIC FUNCTION PANEL
ALBUMIN: 3.9 g/dL (ref 3.5–5.2)
ALK PHOS: 61 U/L (ref 39–117)
ALT: 27 U/L (ref 0–53)
AST: 18 U/L (ref 0–37)
Bilirubin, Direct: 0.2 mg/dL (ref 0.0–0.3)
TOTAL PROTEIN: 6.7 g/dL (ref 6.0–8.3)
Total Bilirubin: 0.8 mg/dL (ref 0.2–1.2)

## 2015-01-28 LAB — BASIC METABOLIC PANEL
BUN: 17 mg/dL (ref 6–23)
CHLORIDE: 103 meq/L (ref 96–112)
CO2: 30 meq/L (ref 19–32)
Calcium: 9.8 mg/dL (ref 8.4–10.5)
Creatinine, Ser: 1.14 mg/dL (ref 0.40–1.50)
GFR: 68.4 mL/min (ref >60.00)
Glucose, Bld: 105 mg/dL — ABNORMAL HIGH (ref 70–99)
Potassium: 4.3 mEq/L (ref 3.5–5.1)
SODIUM: 138 meq/L (ref 135–145)

## 2015-01-28 LAB — LIPID PANEL
CHOLESTEROL: 214 mg/dL — AB (ref 0–200)
HDL: 44.8 mg/dL (ref >39.00)
LDL CALC: 139 mg/dL — AB (ref 0–99)
NonHDL: 169.2
TRIGLYCERIDES: 152 mg/dL — AB (ref 0.0–149.0)
Total CHOL/HDL Ratio: 5
VLDL: 30.4 mg/dL (ref 0.0–40.0)

## 2015-01-28 LAB — TSH: TSH: 2.24 u[IU]/mL (ref 0.35–4.50)

## 2015-01-29 ENCOUNTER — Telehealth: Payer: Self-pay

## 2015-01-29 DIAGNOSIS — R7309 Other abnormal glucose: Secondary | ICD-10-CM

## 2015-01-29 NOTE — Telephone Encounter (Signed)
Phone call from Clydie BraunKaren at [facility]. She states the lavender tube was not drawn at the time of patient's labs so a1c can not be added on.   Phone call to patient to let him know he needs to come back in to have a1c drawn.

## 2015-01-29 NOTE — Telephone Encounter (Signed)
Request for add on has been to lab

## 2015-01-29 NOTE — Telephone Encounter (Signed)
-----   Message from Pecola LawlessWilliam F Hopper, MD sent at 01/29/2015  6:35 AM EDT ----- Please add A1c (R73.9)

## 2015-01-29 NOTE — Addendum Note (Signed)
Addended by: Lyanne CoANDREWS, Analysse Quinonez R on: 01/29/2015 03:19 PM   Modules accepted: Orders

## 2015-01-30 ENCOUNTER — Other Ambulatory Visit (INDEPENDENT_AMBULATORY_CARE_PROVIDER_SITE_OTHER): Payer: 59

## 2015-01-30 ENCOUNTER — Other Ambulatory Visit: Payer: Self-pay | Admitting: Internal Medicine

## 2015-01-30 DIAGNOSIS — Z125 Encounter for screening for malignant neoplasm of prostate: Secondary | ICD-10-CM

## 2015-01-30 DIAGNOSIS — R7309 Other abnormal glucose: Secondary | ICD-10-CM | POA: Diagnosis not present

## 2015-01-30 LAB — NMR LIPOPROFILE WITH LIPIDS
Cholesterol, Total: 231 mg/dL — ABNORMAL HIGH (ref 100–199)
HDL PARTICLE NUMBER: 23.8 umol/L — AB (ref >=30.5)
HDL Size: 9.1 nm — ABNORMAL LOW (ref >=9.2)
HDL-C: 51 mg/dL (ref >39)
LARGE HDL: 6.4 umol/L (ref >=4.8)
LDL (calc): 149 mg/dL — ABNORMAL HIGH (ref 0–99)
LDL Particle Number: 1859 nmol/L — ABNORMAL HIGH (ref <1000)
LDL SIZE: 21 nm (ref >=20.8)
LP-IR Score: 63 — ABNORMAL HIGH (ref <=45)
Large VLDL-P: 4.3 nmol/L — ABNORMAL HIGH (ref <=2.7)
Small LDL Particle Number: 412 nmol/L (ref <=527)
TRIGLYCERIDES: 154 mg/dL — AB (ref 0–149)
VLDL Size: 60.3 nm — ABNORMAL HIGH (ref <=46.6)

## 2015-01-30 LAB — HEMOGLOBIN A1C: Hgb A1c MFr Bld: 5 % (ref 4.6–6.5)

## 2015-01-30 LAB — PSA: PSA: 0.63 ng/mL (ref 0.10–4.00)

## 2015-04-21 ENCOUNTER — Other Ambulatory Visit: Payer: Self-pay

## 2015-04-21 ENCOUNTER — Telehealth: Payer: Self-pay

## 2015-04-21 DIAGNOSIS — G4733 Obstructive sleep apnea (adult) (pediatric): Secondary | ICD-10-CM

## 2015-04-21 NOTE — Telephone Encounter (Signed)
Called to schedule pt per Dr. Vickey Huger request. No answer on cell phone, voicemail not set up, not able to leave a message. Will try again later.

## 2015-04-22 NOTE — Telephone Encounter (Signed)
Spoke to Dr. Hazle Quantigby. He is only available on Friday, July 15th in the morning for the sleep visit with Dr. Vickey Hugerohmeier. Made an appt for him at 8:00 Friday, July 15th.

## 2015-05-08 ENCOUNTER — Ambulatory Visit (INDEPENDENT_AMBULATORY_CARE_PROVIDER_SITE_OTHER): Payer: Commercial Managed Care - HMO | Admitting: Neurology

## 2015-05-08 ENCOUNTER — Telehealth: Payer: Self-pay | Admitting: *Deleted

## 2015-05-08 DIAGNOSIS — G4733 Obstructive sleep apnea (adult) (pediatric): Secondary | ICD-10-CM

## 2015-05-09 ENCOUNTER — Ambulatory Visit (INDEPENDENT_AMBULATORY_CARE_PROVIDER_SITE_OTHER): Payer: Commercial Managed Care - HMO | Admitting: Neurology

## 2015-05-09 ENCOUNTER — Encounter: Payer: Self-pay | Admitting: Neurology

## 2015-05-09 VITALS — BP 140/80 | HR 74 | Resp 18 | Ht 74.0 in | Wt 324.0 lb

## 2015-05-09 DIAGNOSIS — E669 Obesity, unspecified: Secondary | ICD-10-CM | POA: Diagnosis not present

## 2015-05-09 DIAGNOSIS — G4733 Obstructive sleep apnea (adult) (pediatric): Secondary | ICD-10-CM | POA: Diagnosis not present

## 2015-05-09 NOTE — Sleep Study (Signed)
Please see the scanned sleep study interpretation located in the Procedure tab within the Chart Review section. 
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
## 2015-05-12 ENCOUNTER — Other Ambulatory Visit: Payer: Self-pay

## 2015-05-12 ENCOUNTER — Telehealth: Payer: Self-pay

## 2015-05-12 DIAGNOSIS — G4733 Obstructive sleep apnea (adult) (pediatric): Secondary | ICD-10-CM

## 2015-05-12 NOTE — Telephone Encounter (Signed)
[facility] Pt may want to buy the cpap outright.

## 2015-05-14 NOTE — Telephone Encounter (Signed)
Allow for [facility] CD

## 2015-07-07 ENCOUNTER — Other Ambulatory Visit: Payer: Self-pay | Admitting: Emergency Medicine

## 2015-07-07 MED ORDER — TELMISARTAN 40 MG PO TABS: 40.0000 mg | ORAL_TABLET | Freq: Every day | ORAL | Status: DC

## 2015-07-30 ENCOUNTER — Telehealth: Payer: Self-pay

## 2015-07-30 NOTE — Telephone Encounter (Signed)
Spoke with Dr. Hazle Quant. He needs an appt with Dr. Vickey Huger regarding his cpap f/u required by insurance. Appt tentatively made with him for 10/31 at 2:30, but pt wanted me to ask Dr. Vickey Huger if she could see him on Fridays. Dr. Vickey Huger said it was ok to scheduled pt for this Friday, 10/7 at 9:00. Spoke to pt and advised him, Pt verbalized understanding.

## 2015-08-01 ENCOUNTER — Encounter: Payer: Self-pay | Admitting: Neurology

## 2015-08-01 ENCOUNTER — Ambulatory Visit (INDEPENDENT_AMBULATORY_CARE_PROVIDER_SITE_OTHER): Payer: Commercial Managed Care - HMO | Admitting: Neurology

## 2015-08-01 VITALS — BP 140/82 | HR 62 | Resp 20 | Ht 75.0 in | Wt 325.5 lb

## 2015-08-01 DIAGNOSIS — G4733 Obstructive sleep apnea (adult) (pediatric): Secondary | ICD-10-CM | POA: Diagnosis not present

## 2015-08-01 DIAGNOSIS — E669 Obesity, unspecified: Secondary | ICD-10-CM | POA: Diagnosis not present

## 2015-08-01 DIAGNOSIS — Z9989 Dependence on other enabling machines and devices: Principal | ICD-10-CM

## 2015-08-01 NOTE — Patient Instructions (Signed)
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
## 2015-08-25 ENCOUNTER — Ambulatory Visit: Payer: Self-pay | Admitting: Neurology

## 2015-08-30 ENCOUNTER — Other Ambulatory Visit: Payer: Self-pay | Admitting: Internal Medicine

## 2015-09-19 ENCOUNTER — Other Ambulatory Visit: Payer: Self-pay | Admitting: Internal Medicine

## 2015-09-19 DIAGNOSIS — I10 Essential (primary) hypertension: Secondary | ICD-10-CM

## 2015-09-19 MED ORDER — TELMISARTAN 80 MG PO TABS: 80.0000 mg | ORAL_TABLET | Freq: Every day | ORAL | Status: DC

## 2015-09-24 ENCOUNTER — Other Ambulatory Visit: Payer: Self-pay

## 2015-09-24 DIAGNOSIS — I10 Essential (primary) hypertension: Secondary | ICD-10-CM

## 2015-09-24 MED ORDER — TELMISARTAN 80 MG PO TABS: 80.0000 mg | ORAL_TABLET | Freq: Every day | ORAL | Status: DC

## 2016-02-05 ENCOUNTER — Other Ambulatory Visit: Payer: Self-pay | Admitting: Internal Medicine

## 2016-03-10 ENCOUNTER — Other Ambulatory Visit: Payer: Self-pay | Admitting: Internal Medicine

## 2016-03-11 NOTE — Telephone Encounter (Signed)
Left message advising dr Hazle Quantdigby to call  Back to schedule appt/get established with new pcp---let [name] know when appt is made so that refill for telmesartan can be sent to pharm

## 2016-05-12 ENCOUNTER — Other Ambulatory Visit: Payer: Self-pay | Admitting: Internal Medicine

## 2016-05-12 NOTE — Telephone Encounter (Signed)
Patient is choosing to go with doctor outside of [place]----patient is to call that office to request refill

## 2016-08-31 ENCOUNTER — Encounter: Payer: Self-pay | Admitting: Gastroenterology

## 2016-12-07 ENCOUNTER — Ambulatory Visit
Admission: RE | Admit: 2016-12-07 | Discharge: 2016-12-07 | Disposition: A | Discharge status: Home / Self Care | Payer: 59 | Source: Ambulatory Visit | Attending: Allergy | Admitting: Allergy

## 2016-12-07 ENCOUNTER — Other Ambulatory Visit: Payer: Self-pay | Admitting: Allergy

## 2016-12-07 DIAGNOSIS — J209 Acute bronchitis, unspecified: Secondary | ICD-10-CM

## 2017-05-27 ENCOUNTER — Ambulatory Visit (INDEPENDENT_AMBULATORY_CARE_PROVIDER_SITE_OTHER): Payer: 59 | Admitting: Podiatry

## 2017-05-27 ENCOUNTER — Other Ambulatory Visit: Payer: Self-pay | Admitting: Podiatry

## 2017-05-27 ENCOUNTER — Ambulatory Visit (INDEPENDENT_AMBULATORY_CARE_PROVIDER_SITE_OTHER): Payer: 59

## 2017-05-27 ENCOUNTER — Encounter: Payer: Self-pay | Admitting: Podiatry

## 2017-05-27 VITALS — BP 145/86 | HR 59 | Resp 16 | Ht 74.5 in | Wt 315.0 lb

## 2017-05-27 DIAGNOSIS — M25579 Pain in unspecified ankle and joints of unspecified foot: Secondary | ICD-10-CM

## 2017-05-27 DIAGNOSIS — M779 Enthesopathy, unspecified: Secondary | ICD-10-CM

## 2017-05-27 MED ORDER — TRIAMCINOLONE ACETONIDE 10 MG/ML IJ SUSP
10.0000 mg | Freq: Once | INTRAMUSCULAR | Status: AC
  Administered 2017-05-27: 10 mg

## 2017-05-27 NOTE — Progress Notes (Signed)
Subjective:    Patient ID: [name], male   DOB: 66 y.o.   MRN: [id number]   HPI patient states that he's had a lot of swelling in his ankles the last few years and gets pain in the inside of the ankles both feet and also in the ankle joints. States that the ankles are without get the most sore currently    Review of Systems  All other systems reviewed and are negative.       Objective:  Physical Exam  Cardiovascular: Intact distal pulses.   Pulmonary/Chest: Breath sounds normal.  Musculoskeletal: Normal range of motion.  Neurological: He is alert.  Skin: Skin is warm.  Nursing note and vitals reviewed.  neurovascular status was found to be intact muscle strength was adequate with range of motion mildly restricted subtalar joint. Patient's found to have quite a bit of discomfort in the sinus tarsi bilateral with inflammation and was noted to have medial discomfort within the posterior tib region but does have pitting edema +2 on the left +1 on the right. No other significant pathology was noted     Assessment:    Sinus tarsitis bilateral with inflammation along with mild to moderate discomfort in the posterior tib area with edema     Plan:    H&P x-rays reviewed conditions discussed. At this point I did carefully injected sinus tarsi bilateral 3 mg Kenalog 5 mill grams Xylocaine to try to reduce inflammation and for the medial side I did dispensed ankle compression stockings to try to reduce swelling. I then advised on elevation and patient will be seen back for us to recheck again for orthotics as his old pair is no longer is satisfactory and I do want him still in graphite type orthotic but I would like him to have a full-length for his tennis shoes he will see Raiford NobleRick for this  X-rays indicate that his arch height is within normal limits with no signs of collapse

## 2017-05-30 ENCOUNTER — Ambulatory Visit (INDEPENDENT_AMBULATORY_CARE_PROVIDER_SITE_OTHER): Payer: 59 | Admitting: Orthotics

## 2017-05-30 DIAGNOSIS — M779 Enthesopathy, unspecified: Secondary | ICD-10-CM | POA: Diagnosis not present

## 2017-05-30 DIAGNOSIS — M25579 Pain in unspecified ankle and joints of unspecified foot: Secondary | ICD-10-CM

## 2017-05-31 NOTE — Progress Notes (Signed)
[name] came into today to be cast for [employer]. Upon recommendation of Dr. Charlsie Merlesegal [name] presents with Sinus Tarsi and bilat medial knee pain Goals are to provide lateral ankle stability, add pronatory torque to RF biomechanics, wedge lateral to relieve pain in knee Plan vendor Richie

## 2017-06-20 ENCOUNTER — Other Ambulatory Visit: Payer: 59 | Admitting: Orthotics

## 2017-10-27 DIAGNOSIS — I10 Essential (primary) hypertension: Secondary | ICD-10-CM | POA: Diagnosis not present

## 2017-10-27 DIAGNOSIS — N183 Chronic kidney disease, stage 3 (moderate): Secondary | ICD-10-CM | POA: Diagnosis not present

## 2017-10-27 DIAGNOSIS — R7301 Impaired fasting glucose: Secondary | ICD-10-CM | POA: Diagnosis not present

## 2017-10-27 DIAGNOSIS — N390 Urinary tract infection, site not specified: Secondary | ICD-10-CM | POA: Diagnosis not present

## 2017-10-27 DIAGNOSIS — Z125 Encounter for screening for malignant neoplasm of prostate: Secondary | ICD-10-CM | POA: Diagnosis not present

## 2017-11-02 DIAGNOSIS — R7301 Impaired fasting glucose: Secondary | ICD-10-CM | POA: Diagnosis not present

## 2017-11-02 DIAGNOSIS — I10 Essential (primary) hypertension: Secondary | ICD-10-CM | POA: Diagnosis not present

## 2017-11-02 DIAGNOSIS — N528 Other male erectile dysfunction: Secondary | ICD-10-CM | POA: Diagnosis not present

## 2017-11-02 DIAGNOSIS — R779 Abnormality of plasma protein, unspecified: Secondary | ICD-10-CM | POA: Diagnosis not present

## 2017-11-02 DIAGNOSIS — N183 Chronic kidney disease, stage 3 (moderate): Secondary | ICD-10-CM | POA: Diagnosis not present

## 2017-11-02 DIAGNOSIS — J45998 Other asthma: Secondary | ICD-10-CM | POA: Diagnosis not present

## 2017-11-02 DIAGNOSIS — Z0000 Encounter for general adult medical examination without abnormal findings: Secondary | ICD-10-CM | POA: Diagnosis not present

## 2017-11-02 DIAGNOSIS — E7849 Other hyperlipidemia: Secondary | ICD-10-CM | POA: Diagnosis not present

## 2017-11-02 DIAGNOSIS — I700 Atherosclerosis of aorta: Secondary | ICD-10-CM | POA: Diagnosis not present

## 2017-11-08 DIAGNOSIS — Z1212 Encounter for screening for malignant neoplasm of rectum: Secondary | ICD-10-CM | POA: Diagnosis not present

## 2017-11-16 DIAGNOSIS — Z1212 Encounter for screening for malignant neoplasm of rectum: Secondary | ICD-10-CM | POA: Diagnosis not present

## 2017-11-16 DIAGNOSIS — Z1211 Encounter for screening for malignant neoplasm of colon: Secondary | ICD-10-CM | POA: Diagnosis not present

## 2017-11-23 DIAGNOSIS — M4312 Spondylolisthesis, cervical region: Secondary | ICD-10-CM | POA: Diagnosis not present

## 2017-11-23 DIAGNOSIS — M5030 Other cervical disc degeneration, unspecified cervical region: Secondary | ICD-10-CM | POA: Diagnosis not present

## 2017-11-23 DIAGNOSIS — M542 Cervicalgia: Secondary | ICD-10-CM | POA: Diagnosis not present

## 2017-11-23 DIAGNOSIS — M546 Pain in thoracic spine: Secondary | ICD-10-CM | POA: Diagnosis not present

## 2017-11-23 DIAGNOSIS — M5412 Radiculopathy, cervical region: Secondary | ICD-10-CM | POA: Diagnosis not present

## 2017-11-23 DIAGNOSIS — M549 Dorsalgia, unspecified: Secondary | ICD-10-CM | POA: Diagnosis not present

## 2017-11-23 DIAGNOSIS — M4722 Other spondylosis with radiculopathy, cervical region: Secondary | ICD-10-CM | POA: Diagnosis not present

## 2017-12-26 DIAGNOSIS — J4530 Mild persistent asthma, uncomplicated: Secondary | ICD-10-CM | POA: Diagnosis not present

## 2017-12-26 DIAGNOSIS — J3089 Other allergic rhinitis: Secondary | ICD-10-CM | POA: Diagnosis not present

## 2018-01-16 DIAGNOSIS — Z6841 Body Mass Index (BMI) 40.0 and over, adult: Secondary | ICD-10-CM | POA: Diagnosis not present

## 2018-01-16 DIAGNOSIS — R0781 Pleurodynia: Secondary | ICD-10-CM | POA: Diagnosis not present

## 2018-02-01 ENCOUNTER — Encounter: Payer: Self-pay | Admitting: Neurology

## 2018-02-28 ENCOUNTER — Ambulatory Visit: Payer: 59 | Admitting: Neurology

## 2018-03-10 ENCOUNTER — Ambulatory Visit (INDEPENDENT_AMBULATORY_CARE_PROVIDER_SITE_OTHER): Payer: Medicare HMO | Admitting: Neurology

## 2018-03-10 ENCOUNTER — Encounter: Payer: Self-pay | Admitting: Neurology

## 2018-03-10 DIAGNOSIS — G4733 Obstructive sleep apnea (adult) (pediatric): Secondary | ICD-10-CM | POA: Diagnosis not present
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
## 2018-04-03 ENCOUNTER — Ambulatory Visit (INDEPENDENT_AMBULATORY_CARE_PROVIDER_SITE_OTHER): Payer: Medicare HMO | Admitting: Neurology

## 2018-04-03 DIAGNOSIS — G4733 Obstructive sleep apnea (adult) (pediatric): Secondary | ICD-10-CM | POA: Diagnosis not present
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
## 2018-04-05 ENCOUNTER — Telehealth: Payer: Self-pay | Admitting: Neurology

## 2018-04-05 NOTE — Telephone Encounter (Signed)
The related Procedure Encounter could not be closed- was listed as "Cancelled" (?) This means I cannot generate a result note and therefor applied the results to this phone note.       [facility] [address] [place], [place] [address] NAME:   Anthony ChimesDonald Recendez, M.D.                                                               DOB: 1951/06/05 MEDICAL RECORD NUMBER [id number]                                                         DOS:  04/03/2018 REFERRING PHYSICIAN: Rodrigo RanMark Perini, M.D. 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 His BMI was 41.6 at the time, the AHI was 25.4 /h (3% score) and strongly supine dependent.  His total desaturation time was 38.5 min. with a very low SpO2 nadir of 64%.  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 Overall there is poor compliance as he feels the CPAP wakes him up, partially due to air leakage -it has not contributed to better sleep. He took a break of 2-3 month from CPAP and felt no difference is daytime alertness.  He sleeps on his right or left side, but has back, sciatica, and neck pain. His wife has noted significant reduction snoring. He would like to obtain HST to see if apnea is still significant enough to warrant CPAP. He uses Modafinil on certain days at work - He reports much less sleepiness. BMI: 41.8. Epworth Sleepiness Score at 9 points, FSS 12 points,   STUDY RESULTS:  Total Recording Time: 6 hours, 56 minutes; valid test time 5 h and 37 min.  Total Apnea/Hypopnea Index (AHI):  29.1/h; RDI: 29.5/h Average Oxygen Saturation:   92 %:  Lowest Oxygen Saturation: 61 %  Total Time Oxygen Saturation below 89 %: 32.4 minutes, just under 10 % of test time.  Average Heart Rate:  61 bpm in  NSR (between 46 and 92 bpm) IMPRESSION: There is still Moderately Severe Obstructive Sleep Apnea present.  Snoring was mild to moderately loud.  Hypoxemia for about 9% of test time is clinically significant. RECOMMENDATION: Since CPAP therapy ( still my first choice of treatment) was not felt to be beneficial, I would recommend evaluation for a dental device, avoiding supine sleep, and encourage weight loss.   I certify that I have reviewed the raw data recording prior to the issuance of this report in accordance with the standards of the American Academy of Sleep Medicine (AASM). Melvyn Novasarmen Augusta Hilbert, M.D.     04-05-2018    Medical Director of [facility], accredited by the AASM. Diplomat of the ABPN and ABSM. Fellow of the AASM and AAN.

## 2018-04-05 NOTE — Procedures (Unsigned)
[facility] [address] [place], [place] [address] NAME:   Nelson ChimesDonald Keir, M.D.                                                               DOB: 1951-02-09 MEDICAL RECORD NUMBER [id number]                                                         DOS:  04/03/2018 REFERRING PHYSICIAN: Rodrigo RanMark Perini, M.D. 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 His BMI was 41.6 at the time, the AHI was 25.4 /h (3% score) and strongly supine dependent.  His total desaturation time was 38.5 min. with a very low SpO2 nadir of 64%.  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 Overall there is poor compliance as he feels the CPAP wakes him up, partially due to air leakage -it has not contributed to better sleep. He took a break of 2-3 month from CPAP and felt no difference is daytime alertness.  He sleeps on his right or left side, but has back, sciatica, and neck pain. His wife has noted significant reduction snoring. He would like to obtain HST to see if apnea is still significant enough to warrant CPAP. He uses Modafinil on certain days at work - He reports much less sleepiness. BMI: 41.8. Epworth Sleepiness Score at 9 points, FSS 12 points,   STUDY RESULTS:  Total Recording Time: 6 hours, 56 minutes; valid test time 5 h and 37 min.  Total Apnea/Hypopnea Index (AHI):  29.1/h; RDI: 29.5/h Average Oxygen Saturation:   92 %:  Lowest Oxygen Saturation: 61 %  Total Time Oxygen Saturation below 89 %: 32.4 minutes, just under 10 % of test time.  Average Heart Rate:  61 bpm in NSR (between 46 and 92 bpm) IMPRESSION: There is still Moderately Severe Obstructive Sleep Apnea present.  Snoring was mild to moderately loud.  Hypoxemia for about 9% of test time is  clinically significant. RECOMMENDATION: Since CPAP therapy ( still my first choice of treatment) was not felt to be beneficial, I would recommend evaluation for a dental device, avoiding supine sleep, and encourage weight loss.   I certify that I have reviewed the raw data recording prior to the issuance of this report in accordance with the standards of the American Academy of Sleep Medicine (AASM). Melvyn Novasarmen Ned Kakar, M.D.     04-05-2018    Medical Director of [facility], accredited by the AASM. Diplomat of the ABPN and ABSM. Fellow of the AASM and AAN.

## 2018-04-05 NOTE — Telephone Encounter (Signed)
Called patient to discuss sleep study results. No answer at this time. LVM for the patient to call back.   

## 2018-04-10 ENCOUNTER — Other Ambulatory Visit: Payer: Self-pay | Admitting: Neurology

## 2018-04-10 DIAGNOSIS — G4733 Obstructive sleep apnea (adult) (pediatric): Secondary | ICD-10-CM

## 2018-04-10 NOTE — Telephone Encounter (Signed)
Pt returned call and I went over the sleep study results with the pt.  Based off these results the patient is aggreeable to starting the CPAP back up. Currently his CPAP is a auto CPAP 5-15 cm of water pressure. The patient is asking to be changed over to a different company. I have suggested [employer] and informed him that I should have everything I should need to get him transferred. The current machine he has is < 535 years old so he is not due for another. I will attempt a transfer of care for the patient. I will verify that the same settings that he currently has is what Dr Vickey Hugerohmeier would like to do before transferring. Pt verbalized understanding. Pt had no questions at this time but was encouraged to call back if questions arise.

## 2018-04-10 NOTE — Telephone Encounter (Signed)
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 Will inform him of that.

## 2018-04-10 NOTE — Telephone Encounter (Signed)
Made 2nd attempt to call the pt. No answer. LVM for him to call back.

## 2018-08-23 DIAGNOSIS — L570 Actinic keratosis: Secondary | ICD-10-CM | POA: Diagnosis not present

## 2018-08-23 DIAGNOSIS — L821 Other seborrheic keratosis: Secondary | ICD-10-CM | POA: Diagnosis not present

## 2018-10-11 DIAGNOSIS — Z6841 Body Mass Index (BMI) 40.0 and over, adult: Secondary | ICD-10-CM | POA: Diagnosis not present

## 2018-10-11 DIAGNOSIS — R635 Abnormal weight gain: Secondary | ICD-10-CM | POA: Diagnosis not present

## 2018-10-24 DIAGNOSIS — Z6841 Body Mass Index (BMI) 40.0 and over, adult: Secondary | ICD-10-CM | POA: Diagnosis not present

## 2018-10-24 DIAGNOSIS — E781 Pure hyperglyceridemia: Secondary | ICD-10-CM | POA: Diagnosis not present

## 2018-10-24 DIAGNOSIS — G4733 Obstructive sleep apnea (adult) (pediatric): Secondary | ICD-10-CM | POA: Diagnosis not present

## 2018-10-24 DIAGNOSIS — Z9989 Dependence on other enabling machines and devices: Secondary | ICD-10-CM | POA: Diagnosis not present

## 2018-10-24 DIAGNOSIS — E8881 Metabolic syndrome: Secondary | ICD-10-CM | POA: Diagnosis not present

## 2018-10-24 DIAGNOSIS — I10 Essential (primary) hypertension: Secondary | ICD-10-CM | POA: Diagnosis not present

## 2018-11-24 DIAGNOSIS — Z6839 Body mass index (BMI) 39.0-39.9, adult: Secondary | ICD-10-CM | POA: Diagnosis not present

## 2018-11-24 DIAGNOSIS — I10 Essential (primary) hypertension: Secondary | ICD-10-CM | POA: Diagnosis not present

## 2018-11-24 DIAGNOSIS — E669 Obesity, unspecified: Secondary | ICD-10-CM | POA: Diagnosis not present

## 2018-11-24 DIAGNOSIS — E8881 Metabolic syndrome: Secondary | ICD-10-CM | POA: Diagnosis not present

## 2018-11-24 DIAGNOSIS — E661 Drug-induced obesity: Secondary | ICD-10-CM | POA: Diagnosis not present

## 2018-12-01 DIAGNOSIS — E7849 Other hyperlipidemia: Secondary | ICD-10-CM | POA: Diagnosis not present

## 2018-12-01 DIAGNOSIS — R82998 Other abnormal findings in urine: Secondary | ICD-10-CM | POA: Diagnosis not present

## 2018-12-01 DIAGNOSIS — R7301 Impaired fasting glucose: Secondary | ICD-10-CM | POA: Diagnosis not present

## 2018-12-01 DIAGNOSIS — I10 Essential (primary) hypertension: Secondary | ICD-10-CM | POA: Diagnosis not present

## 2018-12-01 DIAGNOSIS — Z125 Encounter for screening for malignant neoplasm of prostate: Secondary | ICD-10-CM | POA: Diagnosis not present

## 2018-12-06 DIAGNOSIS — M5030 Other cervical disc degeneration, unspecified cervical region: Secondary | ICD-10-CM | POA: Diagnosis not present

## 2018-12-06 DIAGNOSIS — N528 Other male erectile dysfunction: Secondary | ICD-10-CM | POA: Diagnosis not present

## 2018-12-06 DIAGNOSIS — I700 Atherosclerosis of aorta: Secondary | ICD-10-CM | POA: Diagnosis not present

## 2018-12-06 DIAGNOSIS — J45998 Other asthma: Secondary | ICD-10-CM | POA: Diagnosis not present

## 2018-12-06 DIAGNOSIS — N183 Chronic kidney disease, stage 3 (moderate): Secondary | ICD-10-CM | POA: Diagnosis not present

## 2018-12-06 DIAGNOSIS — R7301 Impaired fasting glucose: Secondary | ICD-10-CM | POA: Diagnosis not present

## 2018-12-06 DIAGNOSIS — Z0000 Encounter for general adult medical examination without abnormal findings: Secondary | ICD-10-CM | POA: Diagnosis not present

## 2018-12-06 DIAGNOSIS — E7849 Other hyperlipidemia: Secondary | ICD-10-CM | POA: Diagnosis not present

## 2018-12-06 DIAGNOSIS — R779 Abnormality of plasma protein, unspecified: Secondary | ICD-10-CM | POA: Diagnosis not present

## 2018-12-07 DIAGNOSIS — Z1212 Encounter for screening for malignant neoplasm of rectum: Secondary | ICD-10-CM | POA: Diagnosis not present

## 2018-12-29 DIAGNOSIS — I10 Essential (primary) hypertension: Secondary | ICD-10-CM | POA: Diagnosis not present

## 2018-12-29 DIAGNOSIS — E8881 Metabolic syndrome: Secondary | ICD-10-CM | POA: Diagnosis not present

## 2018-12-29 DIAGNOSIS — Z6841 Body Mass Index (BMI) 40.0 and over, adult: Secondary | ICD-10-CM | POA: Diagnosis not present

## 2018-12-29 DIAGNOSIS — E781 Pure hyperglyceridemia: Secondary | ICD-10-CM | POA: Diagnosis not present

## 2018-12-29 DIAGNOSIS — E669 Obesity, unspecified: Secondary | ICD-10-CM | POA: Diagnosis not present

## 2019-02-07 DIAGNOSIS — Z713 Dietary counseling and surveillance: Secondary | ICD-10-CM | POA: Diagnosis not present

## 2019-04-04 DIAGNOSIS — J4530 Mild persistent asthma, uncomplicated: Secondary | ICD-10-CM | POA: Diagnosis not present

## 2019-04-04 DIAGNOSIS — J3089 Other allergic rhinitis: Secondary | ICD-10-CM | POA: Diagnosis not present

## 2019-06-15 DIAGNOSIS — Z6839 Body mass index (BMI) 39.0-39.9, adult: Secondary | ICD-10-CM | POA: Diagnosis not present

## 2019-06-15 DIAGNOSIS — I10 Essential (primary) hypertension: Secondary | ICD-10-CM | POA: Diagnosis not present

## 2019-06-15 DIAGNOSIS — E782 Mixed hyperlipidemia: Secondary | ICD-10-CM | POA: Diagnosis not present

## 2019-06-15 DIAGNOSIS — E8881 Metabolic syndrome: Secondary | ICD-10-CM | POA: Diagnosis not present

## 2019-06-15 DIAGNOSIS — E669 Obesity, unspecified: Secondary | ICD-10-CM | POA: Diagnosis not present

## 2019-07-03 ENCOUNTER — Other Ambulatory Visit: Payer: Self-pay

## 2019-07-03 DIAGNOSIS — Z20822 Contact with and (suspected) exposure to covid-19: Secondary | ICD-10-CM

## 2019-07-03 DIAGNOSIS — R6889 Other general symptoms and signs: Secondary | ICD-10-CM | POA: Diagnosis not present

## 2019-07-05 LAB — NOVEL CORONAVIRUS, NAA: SARS-CoV-2, NAA: NOT DETECTED

## 2019-07-06 DIAGNOSIS — Z23 Encounter for immunization: Secondary | ICD-10-CM | POA: Diagnosis not present

## 2019-08-29 ENCOUNTER — Other Ambulatory Visit: Payer: Self-pay

## 2019-08-29 DIAGNOSIS — Z20822 Contact with and (suspected) exposure to covid-19: Secondary | ICD-10-CM

## 2019-08-30 LAB — NOVEL CORONAVIRUS, NAA: SARS-CoV-2, NAA: NOT DETECTED

## 2019-08-31 ENCOUNTER — Telehealth: Payer: Self-pay | Admitting: Internal Medicine

## 2019-08-31 NOTE — Telephone Encounter (Signed)
Negative COVID results given. Patient results "NOT Detected." Caller expressed understanding. ° °

## 2019-09-14 DIAGNOSIS — Z6839 Body mass index (BMI) 39.0-39.9, adult: Secondary | ICD-10-CM | POA: Diagnosis not present

## 2019-09-14 DIAGNOSIS — E782 Mixed hyperlipidemia: Secondary | ICD-10-CM | POA: Diagnosis not present

## 2019-09-14 DIAGNOSIS — I10 Essential (primary) hypertension: Secondary | ICD-10-CM | POA: Diagnosis not present

## 2019-09-14 DIAGNOSIS — E8881 Metabolic syndrome: Secondary | ICD-10-CM | POA: Diagnosis not present

## 2019-09-14 DIAGNOSIS — E669 Obesity, unspecified: Secondary | ICD-10-CM | POA: Diagnosis not present

## 2019-11-30 DIAGNOSIS — R7301 Impaired fasting glucose: Secondary | ICD-10-CM | POA: Diagnosis not present

## 2019-11-30 DIAGNOSIS — E7849 Other hyperlipidemia: Secondary | ICD-10-CM | POA: Diagnosis not present

## 2019-11-30 DIAGNOSIS — Z125 Encounter for screening for malignant neoplasm of prostate: Secondary | ICD-10-CM | POA: Diagnosis not present

## 2019-12-10 DIAGNOSIS — I700 Atherosclerosis of aorta: Secondary | ICD-10-CM | POA: Diagnosis not present

## 2019-12-10 DIAGNOSIS — G4733 Obstructive sleep apnea (adult) (pediatric): Secondary | ICD-10-CM | POA: Diagnosis not present

## 2019-12-10 DIAGNOSIS — M5030 Other cervical disc degeneration, unspecified cervical region: Secondary | ICD-10-CM | POA: Diagnosis not present

## 2019-12-10 DIAGNOSIS — K589 Irritable bowel syndrome without diarrhea: Secondary | ICD-10-CM | POA: Diagnosis not present

## 2019-12-10 DIAGNOSIS — Z0000 Encounter for general adult medical examination without abnormal findings: Secondary | ICD-10-CM | POA: Diagnosis not present

## 2019-12-10 DIAGNOSIS — E669 Obesity, unspecified: Secondary | ICD-10-CM | POA: Diagnosis not present

## 2019-12-10 DIAGNOSIS — E785 Hyperlipidemia, unspecified: Secondary | ICD-10-CM | POA: Diagnosis not present

## 2019-12-10 DIAGNOSIS — R779 Abnormality of plasma protein, unspecified: Secondary | ICD-10-CM | POA: Diagnosis not present

## 2019-12-10 DIAGNOSIS — R7301 Impaired fasting glucose: Secondary | ICD-10-CM | POA: Diagnosis not present

## 2019-12-10 DIAGNOSIS — R82998 Other abnormal findings in urine: Secondary | ICD-10-CM | POA: Diagnosis not present

## 2019-12-10 DIAGNOSIS — Z1331 Encounter for screening for depression: Secondary | ICD-10-CM | POA: Diagnosis not present

## 2019-12-10 DIAGNOSIS — N529 Male erectile dysfunction, unspecified: Secondary | ICD-10-CM | POA: Diagnosis not present

## 2019-12-10 DIAGNOSIS — I10 Essential (primary) hypertension: Secondary | ICD-10-CM | POA: Diagnosis not present

## 2019-12-10 DIAGNOSIS — J45909 Unspecified asthma, uncomplicated: Secondary | ICD-10-CM | POA: Diagnosis not present

## 2019-12-10 DIAGNOSIS — N183 Chronic kidney disease, stage 3 unspecified: Secondary | ICD-10-CM | POA: Diagnosis not present

## 2019-12-11 DIAGNOSIS — Z1212 Encounter for screening for malignant neoplasm of rectum: Secondary | ICD-10-CM | POA: Diagnosis not present

## 2019-12-13 ENCOUNTER — Other Ambulatory Visit: Payer: Self-pay

## 2019-12-13 ENCOUNTER — Other Ambulatory Visit: Payer: Self-pay | Admitting: Internal Medicine

## 2019-12-13 DIAGNOSIS — E7849 Other hyperlipidemia: Secondary | ICD-10-CM

## 2019-12-14 ENCOUNTER — Ambulatory Visit
Admission: RE | Admit: 2019-12-14 | Discharge: 2019-12-14 | Disposition: A | Discharge status: Home / Self Care | Payer: No Typology Code available for payment source | Source: Ambulatory Visit | Attending: Internal Medicine | Admitting: Internal Medicine

## 2019-12-14 DIAGNOSIS — I2510 Atherosclerotic heart disease of native coronary artery without angina pectoris: Secondary | ICD-10-CM | POA: Diagnosis not present

## 2019-12-14 DIAGNOSIS — E7800 Pure hypercholesterolemia, unspecified: Secondary | ICD-10-CM | POA: Diagnosis not present

## 2019-12-14 DIAGNOSIS — E7849 Other hyperlipidemia: Secondary | ICD-10-CM

## 2019-12-24 DIAGNOSIS — L249 Irritant contact dermatitis, unspecified cause: Secondary | ICD-10-CM | POA: Diagnosis not present

## 2019-12-24 DIAGNOSIS — L570 Actinic keratosis: Secondary | ICD-10-CM | POA: Diagnosis not present

## 2019-12-24 DIAGNOSIS — L821 Other seborrheic keratosis: Secondary | ICD-10-CM | POA: Diagnosis not present

## 2019-12-26 ENCOUNTER — Telehealth: Payer: Self-pay | Admitting: Cardiovascular Disease

## 2019-12-26 NOTE — Telephone Encounter (Signed)
Patient scheduled for a new patient appointment on Friday 3/5 at 10:40am.

## 2019-12-26 NOTE — Telephone Encounter (Signed)
Patient states he is requesting to speak with Dr. Landry Dyke nurse in regards to scheduling new patient appointment. Patient is inquiring about whether or not he may be seen on specific days after 1 PM. Please advise.

## 2019-12-28 ENCOUNTER — Other Ambulatory Visit: Payer: Self-pay

## 2019-12-28 ENCOUNTER — Encounter: Payer: Self-pay | Admitting: Cardiovascular Disease

## 2019-12-28 ENCOUNTER — Ambulatory Visit: Payer: PPO | Admitting: Cardiovascular Disease

## 2019-12-28 VITALS — BP 140/80 | HR 60 | Temp 97.9°F | Ht 74.0 in | Wt 303.0 lb

## 2019-12-28 DIAGNOSIS — I10 Essential (primary) hypertension: Secondary | ICD-10-CM | POA: Diagnosis not present

## 2019-12-28 DIAGNOSIS — R931 Abnormal findings on diagnostic imaging of heart and coronary circulation: Secondary | ICD-10-CM

## 2019-12-28 DIAGNOSIS — Z6838 Body mass index (BMI) 38.0-38.9, adult: Secondary | ICD-10-CM | POA: Diagnosis not present

## 2019-12-28 DIAGNOSIS — E669 Obesity, unspecified: Secondary | ICD-10-CM

## 2019-12-28 DIAGNOSIS — I700 Atherosclerosis of aorta: Secondary | ICD-10-CM | POA: Diagnosis not present

## 2019-12-28 DIAGNOSIS — E785 Hyperlipidemia, unspecified: Secondary | ICD-10-CM | POA: Diagnosis not present

## 2019-12-28 DIAGNOSIS — G4733 Obstructive sleep apnea (adult) (pediatric): Secondary | ICD-10-CM | POA: Diagnosis not present

## 2019-12-28 DIAGNOSIS — E8881 Metabolic syndrome: Secondary | ICD-10-CM | POA: Diagnosis not present

## 2019-12-28 DIAGNOSIS — E782 Mixed hyperlipidemia: Secondary | ICD-10-CM | POA: Diagnosis not present

## 2019-12-28 MED ORDER — PROPRANOLOL HCL 10 MG PO TABS: 10.0000 mg | ORAL_TABLET | Freq: Two times a day (BID) | ORAL | 3 refills | Status: DC

## 2019-12-28 NOTE — Patient Instructions (Addendum)
Medication Instructions:  INCREASE Propranolol to 10mg  twice a day   *If you need a refill on your cardiac medications before your next appointment, please call your pharmacy*   Lab Work: At the end of May, fasting labs:  cmet Lipid  If you have labs (blood work) drawn today and your tests are completely normal, you will receive your results only by: Taejon Irani MyChart Message (if you have MyChart) OR . A paper copy in the mail If you have any lab test that is abnormal or we need to change your treatment, we will call you to review the results.   Testing/Procedures: Your physician has requested that you have an echocardiogram. Echocardiography is a painless test that uses sound waves to create images of your heart. It provides your doctor with information about the size and shape of your heart and how well your heart's chambers and valves are working. This procedure takes approximately one hour. There are no restrictions for this procedure.  [address] [address]  Your physician has recommended that you have a sleep study. This test records several body functions during sleep, including: brain activity, eye movement, oxygen and carbon dioxide blood levels, heart rate and rhythm, breathing rate and rhythm, the flow of air through your mouth and nose, snoring, body muscle movements, and chest and belly movement.  OUR SLEEP COORDINATOR WILL CONTACT YOU    Follow-Up: At [facility], you and your health needs are our priority.  As part of our continuing mission to provide you with exceptional heart care, we have created designated Provider Care Teams.  These Care Teams include your primary Cardiologist (physician) and Advanced Practice Providers (APPs -  Physician Assistants and Nurse Practitioners) who all work together to provide you with the care you need, when you need it.  We recommend signing up for the patient portal called "MyChart".  Sign up information is provided on this After Visit  Summary.  MyChart is used to connect with patients for Virtual Visits (Telemedicine).  Patients are able to view lab/test results, encounter notes, upcoming appointments, etc.  Non-urgent messages can be sent to your provider as well.   To learn more about what you can do with MyChart, go to [facility].    Your next appointment:  IN [place] 3 month(s)  The format for your next appointment:   In Person  Provider:   [birth year]-04-1981, MD

## 2019-12-28 NOTE — Progress Notes (Signed)
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 echocardiogram. Echocardiography is a painless test that uses sound waves to create images of your heart. It provides your doctor with information about the size and shape of your heart and how well your heart's chambers and valves are working. This procedure takes approximately one hour. There are no restrictions for this procedure.  [place] physician has recommended that you have a sleep study. This test records several body functions during sleep, including: brain activity, eye movement, oxygen and carbon dioxide blood levels, heart rate and rhythm, breathing rate and rhythm, the flow of air through your mouth and nose, snoring, body muscle movements, and chest and belly movement.  OUR SLEEP COORDINATOR WILL CONTACT YOU    Follow-Up: At [facility], you and your health needs are our priority.  As part of our continuing mission to provide you with exceptional heart care, we have created designated Provider Care Teams.  These Care Teams include your primary Cardiologist (physician) and Advanced Practice Providers (APPs -  Physician Assistants and  Nurse Practitioners) who all work together to provide you with the care you need, when you need it.  We recommend signing up for the patient portal called "MyChart".  Sign up information is provided on this After Visit Summary.  MyChart is used to connect with patients for Virtual Visits (Telemedicine).  Patients are able to view lab/test results, encounter notes, upcoming appointments, etc.  Non-urgent messages can be sent to your provider as well.   To learn more about what you can do with MyChart, go to [email or web address].    Your next appointment:  IN JUNE 3 month(s)  The format for your next appointment:   In Person  Provider:   Shelva Majestic, MD         Signed, Anthony Majestic, MD  12/30/2019 11:56 AM    [facility] [address], [place], [place], [place]  [address] Phone: [phone]

## 2019-12-30 ENCOUNTER — Encounter: Payer: Self-pay | Admitting: Cardiovascular Disease

## 2020-01-09 DIAGNOSIS — Z713 Dietary counseling and surveillance: Secondary | ICD-10-CM | POA: Diagnosis not present

## 2020-01-18 ENCOUNTER — Other Ambulatory Visit: Payer: Self-pay

## 2020-01-18 ENCOUNTER — Ambulatory Visit (HOSPITAL_COMMUNITY): Payer: PPO | Attending: Cardiology

## 2020-01-18 DIAGNOSIS — I10 Essential (primary) hypertension: Secondary | ICD-10-CM | POA: Diagnosis not present

## 2020-01-18 DIAGNOSIS — G4733 Obstructive sleep apnea (adult) (pediatric): Secondary | ICD-10-CM | POA: Diagnosis not present

## 2020-01-18 MED ORDER — PERFLUTREN LIPID MICROSPHERE
1.0000 mL | INTRAVENOUS | Status: AC | PRN
  Administered 2020-01-18: 2 mL via INTRAVENOUS

## 2020-01-19 ENCOUNTER — Other Ambulatory Visit (HOSPITAL_COMMUNITY): Payer: PPO

## 2020-01-22 ENCOUNTER — Encounter (HOSPITAL_BASED_OUTPATIENT_CLINIC_OR_DEPARTMENT_OTHER): Payer: PPO | Admitting: Cardiovascular Disease

## 2020-02-08 DIAGNOSIS — E782 Mixed hyperlipidemia: Secondary | ICD-10-CM | POA: Diagnosis not present

## 2020-02-08 DIAGNOSIS — I10 Essential (primary) hypertension: Secondary | ICD-10-CM | POA: Diagnosis not present

## 2020-02-08 DIAGNOSIS — Z6839 Body mass index (BMI) 39.0-39.9, adult: Secondary | ICD-10-CM | POA: Diagnosis not present

## 2020-02-08 DIAGNOSIS — E669 Obesity, unspecified: Secondary | ICD-10-CM | POA: Diagnosis not present

## 2020-04-09 DIAGNOSIS — J3089 Other allergic rhinitis: Secondary | ICD-10-CM | POA: Diagnosis not present

## 2020-04-09 DIAGNOSIS — J4530 Mild persistent asthma, uncomplicated: Secondary | ICD-10-CM | POA: Diagnosis not present

## 2020-04-14 ENCOUNTER — Ambulatory Visit: Payer: PPO | Admitting: Cardiovascular Disease

## 2020-04-14 DIAGNOSIS — R002 Palpitations: Secondary | ICD-10-CM | POA: Diagnosis not present

## 2020-04-14 DIAGNOSIS — I10 Essential (primary) hypertension: Secondary | ICD-10-CM | POA: Diagnosis not present

## 2020-04-14 DIAGNOSIS — Z9989 Dependence on other enabling machines and devices: Secondary | ICD-10-CM | POA: Diagnosis not present

## 2020-04-14 DIAGNOSIS — G4733 Obstructive sleep apnea (adult) (pediatric): Secondary | ICD-10-CM | POA: Diagnosis not present

## 2020-04-14 DIAGNOSIS — R9431 Abnormal electrocardiogram [ECG] [EKG]: Secondary | ICD-10-CM | POA: Diagnosis not present

## 2020-04-14 LAB — COMPREHENSIVE METABOLIC PANEL
ALT: 26 IU/L (ref 0–44)
AST: 18 IU/L (ref 0–40)
Albumin/Globulin Ratio: 1.6 (ref 1.2–2.2)
Albumin: 4.2 g/dL (ref 3.8–4.8)
Alkaline Phosphatase: 65 IU/L (ref 48–121)
BUN/Creatinine Ratio: 15 (ref 10–24)
BUN: 18 mg/dL (ref 8–27)
Bilirubin Total: 0.8 mg/dL (ref 0.0–1.2)
CO2: 22 mmol/L (ref 20–29)
Calcium: 9.7 mg/dL (ref 8.6–10.2)
Chloride: 106 mmol/L (ref 96–106)
Creatinine, Ser: 1.2 mg/dL (ref 0.76–1.27)
GFR calc Af Amer: 70 mL/min/{1.73_m2} (ref >59)
GFR calc non Af Amer: 61 mL/min/{1.73_m2} (ref >59)
Globulin, Total: 2.6 g/dL (ref 1.5–4.5)
Glucose: 104 mg/dL — ABNORMAL HIGH (ref 65–99)
Potassium: 4.6 mmol/L (ref 3.5–5.2)
Sodium: 141 mmol/L (ref 134–144)
Total Protein: 6.8 g/dL (ref 6.0–8.5)

## 2020-04-14 LAB — LIPID PANEL
Chol/HDL Ratio: 2.3 ratio (ref 0.0–5.0)
Cholesterol, Total: 120 mg/dL (ref 100–199)
HDL: 53 mg/dL (ref >39)
LDL Chol Calc (NIH): 51 mg/dL (ref 0–99)
Triglycerides: 83 mg/dL (ref 0–149)
VLDL Cholesterol Cal: 16 mg/dL (ref 5–40)

## 2020-04-16 ENCOUNTER — Encounter: Payer: Self-pay | Admitting: Cardiovascular Disease

## 2020-04-16 ENCOUNTER — Telehealth (INDEPENDENT_AMBULATORY_CARE_PROVIDER_SITE_OTHER): Payer: PPO | Admitting: Cardiovascular Disease

## 2020-04-16 VITALS — BP 110/55 | Ht 74.0 in | Wt 306.2 lb

## 2020-04-16 DIAGNOSIS — E669 Obesity, unspecified: Secondary | ICD-10-CM

## 2020-04-16 DIAGNOSIS — E785 Hyperlipidemia, unspecified: Secondary | ICD-10-CM

## 2020-04-16 DIAGNOSIS — I10 Essential (primary) hypertension: Secondary | ICD-10-CM | POA: Diagnosis not present

## 2020-04-16 DIAGNOSIS — G4733 Obstructive sleep apnea (adult) (pediatric): Secondary | ICD-10-CM | POA: Diagnosis not present

## 2020-04-16 NOTE — Progress Notes (Signed)
Virtual Visit via Video Note   This visit type was conducted due to national recommendations for restrictions regarding the COVID-19 Pandemic (e.g. social distancing) in an effort to limit this patient's exposure and mitigate transmission in our community.  Due to his co-morbid illnesses, this patient is at least at moderate risk for complications without adequate follow up.  This format is felt to be most appropriate for this patient at this time.  All issues noted in this document were discussed and addressed.  A limited physical exam was performed with this format.  Please refer to the patient's chart for his consent to telehealth for [facility].   The patient was identified using 2 identifiers.  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 He presents for a 3 month follow-up.   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 for primary care and most recently sees Dr. [name].  He admits to a history of hypertension and most recently has been on telmisartan which he takes 40 mg daily since 2008.  He has a history of hyperlipidemia review of prior records that he brought with him today to his office visit, in 1994 his LDL cholesterol was 214.  In 2006 he had undergone Lipomed NMR evaluation in my review of this data today demonstrates significant increased LDL small particles at 1907 with markedly increased levels of LDL particles at 2954.  Apparently had been on treatment with Crestor but did experience some myalgias.  More recently he has been taking 10 mg 3  times per week but he had been off Crestor for 8 to 9 months.  Review of echo Doppler data from 2008 revealed normal systolic function with EF at 65% with grade 1 diastolic dysfunction.  In 2008 he was hospitalized and was significantly hypertensive.  He underwent cardiac catheterization Dr. Dickie La and was not told of having obstructive disease.  His last echo was done in 2016 which also showed an EF of 60 to 65%.  There was moderate biatrial enlargement.   Remotely, the patient had seen Dr. Porfirio Mylar Dohmeier and was diagnosed with struct of sleep apnea.  He underwent an initial sleep study on May 08, 2015 at which time he weighed 324 pounds and BMI was 41.6.  Sleep apnea was moderate with an AHI of 25.4 and was strongly supine dependent.  He had significant oxygen desaturation to a nadir of 64%.  He last saw Dr. Vickey Huger in 2019.  At that time his 95th percentile CPAP pressure was 9.2 cm.  Over the past year and a half, unfortunately his wife was diagnosed with cancer and has been undergoing treatments.  He admits to a 35 pound weight loss since January 2020.  He has not used CPAP in over a year.  On questioning today, he tells me that he does have snoring.  At times he can find himself falling asleep if circumstances persist.  His sleep is not as restorative as it had been previously and  his wife has noticed some apneic events.  In the office  today I did obtain an Epworth Sleepiness Scale score which endorsed at 12 consistent with excessive daytime sleepiness.  He had  seen Dr. Joylene Draft and laboratory from November 30, 2019 showed a total cholesterol of 230, triglycerides 146, HDL 48, LDL 153.  Apolipoprotein B was 117.  BUN 18 creatinine 1.0.  He was referred for a coronary calcium score was done on December 14, 2019 which revealed a total Agatson score of 278 with left main calcium 33, LAD 151, left circumflex 38, and RCA 56.  He was also noted to have aortic atherosclerosis and mild calcification of his  aortic valve was detected.  [name] denies any change in symptoms status.  He specifically denies any chest pain or any exertional dyspnea.  He denies any change in exercise tolerance.  He walks his dog daily at a fast pace and has not noticed any decline in symptoms.  He had called me following his results and now presents for initial cardiology evaluation.  Following his calcium score findings, Crestor was recently increased to 20 mg daily.  During my initial evaluation there is elevated Agaston score of 278 with calcification involving his coronary arteries I recommended the addition of Zetia to his rosuvastatin 20 mg.  I also recommended that he undergo a 2D echo Doppler study to further evaluate his aortic systolic murmur.  This was done on 12/21/2019 which revealed normal systolic function with grade 2 diastolic dysfunction.  His left atrium was mildly dilated.  There was mild mitral regurgitation and mild to moderate aortic sclerosis/calcification without stenosis.   Laboratory on April 14, 2020 marked improvement in lipid studies with total cholesterol 120, triglycerides 83, HDL 53, and LDL 51.  He has tolerated the combination of Crestor 20 mg and Zetia without side effects.    He is asymptomatic with reference to chest pain or shortness of breath or palpitations.  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  He would like to pursue this evaluation since his machine is old and he believes he would benefit from reinitiation of therapy.  He presents for this telemedicine evaluation.  The patient does not have symptoms concerning for COVID-19 infection (fever, chills, cough, or new shortness of breath).    Past Medical History:  Diagnosis Date  . Non-cardiac chest pain 2008   and arm pain; Dr. Eustace Quail.  . Other and unspecified hyperlipidemia    NMR 2006: LDL 206(2954/1907), HDL 43, TG 82. LDL goal = <100. Framingham study  LDL goal =  < 130   Past Surgical History:  Procedure Laterality Date  . CARDIAC CATHETERIZATION  2008   negative; Dr Olevia Perches  . COLONOSCOPY  2009   negative; Dr Deatra Ina  . I&D L knee  2009   for post traumatic cellulitis/abscess  . TONSILLECTOMY       Current Meds  Medication Sig  . albuterol (VENTOLIN HFA) 108 (90 Base) MCG/ACT inhaler Inhale into the lungs.  Marland Kitchen aspirin (BAYER LOW STRENGTH) 81 MG EC tablet Take 81 mg by mouth daily.    Marland Kitchen azelastine (ASTELIN) 0.1 % nasal spray Place into the nose.  . diclofenac (VOLTAREN) 75 MG EC tablet   . ezetimibe (ZETIA) 10 MG tablet Take 10 mg by mouth daily.  . famotidine (PEPCID) 10 MG tablet Take by mouth.  . fluticasone (FLONASE) 50 MCG/ACT nasal spray USE 1 SPRAY IN EACH NOSTRIL TWICE A DAY.  . montelukast (SINGULAIR)  10 MG tablet Take by mouth.  . Multiple Vitamins-Minerals (MACULAR VITAMIN BENEFIT PO) Take by mouth daily.    . propranolol (INDERAL) 10 MG tablet Take 1 tablet (10 mg total) by mouth 2 (two) times daily.  . rosuvastatin (CRESTOR) 10 MG tablet   . tadalafil (CIALIS) 20 MG tablet Take 1 tablet (20 mg total) by mouth every three (3) days as needed for erectile dysfunction. 1 every 3 days prn  . telmisartan (MICARDIS) 80 MG tablet Take 1 tablet (80 mg total) by mouth daily. (Patient taking differently: Take 40 mg by mouth daily. )  . traMADol (ULTRAM) 50 MG tablet      Allergies:   Metoprolol succinate and Breo ellipta [fluticasone furoate-vilanterol]   Social History   Tobacco Use  . Smoking status: Never Smoker  . Smokeless tobacco: Never Used  Substance Use Topics  . Alcohol use: Yes    Alcohol/week: 9.0 standard drinks    Types: 9 Glasses of wine per week    Comment: socially  . Drug use: Not on file     Family Hx: The patient's family history includes Coronary artery disease in an other family member; Dementia in his father; Glaucoma in his father; Heart attack in his cousin, maternal grandfather, and maternal  uncle; Pancreatic cancer in his father; Prostate cancer in his father; Stroke (age of onset: 7484) in his maternal grandfather. There is no history of Asthma or Diabetes.  ROS:   Please see the history of present illness.    No fevers chills night sweats No cough Mild asthma with intermittent use of albuterol No chest pain No recent significant weight loss No abdominal pain No edema OSA currently not on therapy  All other systems reviewed and are negative.   Prior CV studies:   The following studies were reviewed today:  ECHO 01/18/2020 IMPRESSIONS  1. Left ventricular ejection fraction, by estimation, is 60 to 65%. The  left ventricle has normal function. The left ventricle has no regional  wall motion abnormalities. There is mild concentric left ventricular  hypertrophy. Left ventricular diastolic  parameters are consistent with Grade II diastolic dysfunction  (pseudonormalization).  2. Right ventricular systolic function is normal. The right ventricular  size is normal.  3. Left atrial size was mildly dilated.  4. The mitral valve is normal in structure. Mild mitral valve  regurgitation. No evidence of mitral stenosis.  5. The aortic valve is normal in structure. Aortic valve regurgitation is  not visualized. Mild to moderate aortic valve sclerosis/calcification is  present, without any evidence of aortic stenosis.  6. The inferior vena cava is normal in size with greater than 50%  respiratory variability, suggesting right atrial pressure of 3 mmHg.   Labs/Other Tests and Data Reviewed:    EKG:  An ECG dated 12/28/2019 was personally reviewed today and demonstrated:  Normal sinus rhythm at 60 bpm with first-degree AV block.  Small Q-wave in lead III. No significant ST changes.  Parable 250 ms.  QTc interval 456 ms  Recent Labs: 04/14/2020: ALT 26; BUN 18; Creatinine, Ser 1.20; Potassium 4.6; Sodium 141   CMP Latest Ref Rng & Units 04/14/2020 01/28/2015 11/21/2012  Glucose  65 - 99 mg/dL 086(V104(H) 784(O105(H) 962(X108(H)  BUN 8 - 27 mg/dL 18 17 18   Creatinine 0.76 - 1.27 mg/dL 5.281.20 4.131.14 1.1  Sodium 244134 - 144 mmol/L 141 138 138  Potassium 3.5 - 5.2 mmol/L 4.6 4.3 3.9  Chloride 96 - 106 mmol/L 106 103 105  CO2 20 - 29 mmol/L 22 30 28   Calcium 8.6 - 10.2 mg/dL 9.7 9.8 9.6  Total Protein 6.0 - 8.5 g/dL 6.8 6.7 7.0  Total Bilirubin 0.0 - 1.2 mg/dL 0.8 0.8 1.2  Alkaline Phos 48 - 121 IU/L 65 61 53  AST 0 - 40 IU/L 18 18 20   ALT 0 - 44 IU/L 26 27 24     Recent Lipid Panel Lab Results  Component Value Date/Time   CHOL 120 04/14/2020 08:17 AM   CHOL 231 (H) 01/28/2015 08:01 AM   TRIG 83 04/14/2020 08:17 AM   TRIG 154 (H) 01/28/2015 08:01 AM   TRIG 97 08/30/2006 07:36 AM   HDL 53 04/14/2020 08:17 AM   HDL 51 01/28/2015 08:01 AM   CHOLHDL 2.3 04/14/2020 08:17 AM   CHOLHDL 5 01/28/2015 08:01 AM   LDLCALC 51 04/14/2020 08:17 AM   LDLCALC 149 (H) 01/28/2015 08:01 AM   LDLDIRECT 143.4 11/21/2012 08:01 AM    Wt Readings from Last 3 Encounters:  04/16/20 (!) 306 lb 3.2 oz (138.9 kg)  12/28/19 (!) 303 lb (137.4 kg)  03/10/18 (!) 316 lb (143.3 kg)     Objective:    Vital Signs:  BP (!) 110/55   Ht 6\' 2"  (1.88 m)   Wt (!) 306 lb 3.2 oz (138.9 kg)   BMI 39.31 kg/m    Vital signs were reviewed.  Blood pressure remained stable There was no significant change in his physical appearance. Breathing was normal and not labored There was no audible wheezing He denied any chest wall tenderness Heart rhythm has been regular and rate controlled. No abdominal tenderness No swelling No neurologic issues   ASSESSMENT & PLAN:    1. Essential hypertension: Blood pressure today is well controlled on his current medical regimen consisting of telmisartan 80 mg in addition to propranolol which I had increased at his last office visit to 10 mg twice a day which he has taken for his essential tremor. 2. Hyperlipidemia with target LDL less than 70%: He has been documented to have  coronary calcification with a calcium score of 278.  At his initial office visit I recommended increasing rosuvastatin to 20 mg and added Zetia 10 mg.  Most recent lipid panel from 2 days ago was reviewed which showed marked improvement with LDL now down to 51.  I had discussed with him the importance of aggressive lipid-lowering therapy and attempt to induce plaque regression, new stability and prevent progression. 3. Coronary and aortic atherosclerosis: Identified on CT imaging.  Aggressive medical therapy.  LDL is now at target and blood pressure is controlled. 4. Obstructive sleep apnea: Sleep study in July 2016 when he weighed 324 pounds and BMI 41.6 showed moderate overall sleep apnea with an AHI of 25.4 with significant supine dependency.  He had significant oxygen desaturation to a nadir of 64%.  He has an old machine and states that this has a card.  Presently he admits that he is not sleeping well.  On his office evaluation Epworth Sleepiness Scale score endorsed at 12.  He has given me dates when he would be available to return to the sleep lab for follow-up evaluation since he has had some weight loss since his initial study.  We will try to schedule a split-night protocol to be done between the dates of August 3 through June 10, 2020. 5. Aortic murmur: Echo Doppler study has shown normal LV systolic function with grade 2 diastolic dysfunction.  He has mild  to moderate aortic sclerosis/calcification. 6. Obesity: BMI 39.3.  We discussed the importance of weight loss and exercise with American Heart Association recommendations of at least 5 days/week for 30 minutes if at all possible. 7. He has been caring for his wife who is ill with cancer.  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  COVID-19 Education: The signs and symptoms of COVID-19 were discussed with the patient and how to seek care for testing (follow up with PCP or arrange E-visit).   The importance of social distancing was discussed today.  Time:   Today, I have spent 26 minutes with the patient with telehealth technology discussing the above problems.     Medication Adjustments/Labs and Tests Ordered: Current medicines are reviewed at length with the patient today.  Concerns regarding medicines are outlined above.   Tests Ordered: No orders of the defined types were placed in this encounter.   Medication Changes: No orders of the defined types were placed in this encounter.   Follow Up:  4 months in office  Signed, Nicki Guadalajara, MD  04/16/2020 8:02 AM    [facility]

## 2020-04-16 NOTE — Patient Instructions (Signed)
Medication Instructions:  CONTINUE WITH CURRENT MEDICATIONS. NO CHANGES.  *If you need a refill on your cardiac medications before your next appointment, please call your pharmacy*   Testing/Procedures: between the week of August 3rd-17th Your physician has recommended that you have a sleep study. This test records several body functions during sleep, including: brain activity, eye movement, oxygen and carbon dioxide blood levels, heart rate and rhythm, breathing rate and rhythm, the flow of air through your mouth and nose, snoring, body muscle movements, and chest and belly movement. Our sleep coordinator will contact you to schedule this!     Follow-Up: At [facility], you and your health needs are our priority.  As part of our continuing mission to provide you with exceptional heart care, we have created designated Provider Care Teams.  These Care Teams include your primary Cardiologist (physician) and Advanced Practice Providers (APPs -  Physician Assistants and Nurse Practitioners) who all work together to provide you with the care you need, when you need it.  We recommend signing up for the patient portal called "MyChart".  Sign up information is provided on this After Visit Summary.  MyChart is used to connect with patients for Virtual Visits (Telemedicine).  Patients are able to view lab/test results, encounter notes, upcoming appointments, etc.  Non-urgent messages can be sent to your provider as well.   To learn more about what you can do with MyChart, go to [email or web address].    Your next appointment:   4 month(s)  The format for your next appointment:   In Person  Provider:   Nicki Guadalajara, MD

## 2020-04-17 ENCOUNTER — Encounter: Payer: Self-pay | Admitting: Cardiovascular Disease

## 2020-04-21 ENCOUNTER — Telehealth: Payer: Self-pay | Admitting: *Deleted

## 2020-04-21 NOTE — Telephone Encounter (Signed)
PA for in lab sleep study submitted to [facility] via web portal. 

## 2020-04-22 DIAGNOSIS — M19071 Primary osteoarthritis, right ankle and foot: Secondary | ICD-10-CM | POA: Diagnosis not present

## 2020-04-22 DIAGNOSIS — M19072 Primary osteoarthritis, left ankle and foot: Secondary | ICD-10-CM | POA: Diagnosis not present

## 2020-04-22 DIAGNOSIS — M67372 Transient synovitis, left ankle and foot: Secondary | ICD-10-CM | POA: Diagnosis not present

## 2020-04-22 DIAGNOSIS — M67371 Transient synovitis, right ankle and foot: Secondary | ICD-10-CM | POA: Diagnosis not present

## 2020-04-29 ENCOUNTER — Telehealth: Payer: Self-pay | Admitting: Cardiovascular Disease

## 2020-04-29 NOTE — Telephone Encounter (Signed)
She received a request for a sleep study but no clinical notes were attached to the request.

## 2020-05-07 ENCOUNTER — Telehealth: Payer: Self-pay | Admitting: *Deleted

## 2020-05-07 NOTE — Telephone Encounter (Signed)
Left sleep study appointment details on VM. 

## 2020-06-05 ENCOUNTER — Other Ambulatory Visit: Payer: Self-pay

## 2020-06-05 ENCOUNTER — Ambulatory Visit (HOSPITAL_BASED_OUTPATIENT_CLINIC_OR_DEPARTMENT_OTHER): Payer: PPO | Attending: Cardiovascular Disease | Admitting: Cardiovascular Disease

## 2020-06-05 DIAGNOSIS — I493 Ventricular premature depolarization: Secondary | ICD-10-CM | POA: Diagnosis not present

## 2020-06-05 DIAGNOSIS — R0683 Snoring: Secondary | ICD-10-CM | POA: Diagnosis not present

## 2020-06-05 DIAGNOSIS — Z791 Long term (current) use of non-steroidal anti-inflammatories (NSAID): Secondary | ICD-10-CM | POA: Insufficient documentation

## 2020-06-05 DIAGNOSIS — G4733 Obstructive sleep apnea (adult) (pediatric): Secondary | ICD-10-CM | POA: Insufficient documentation

## 2020-06-05 DIAGNOSIS — Z7982 Long term (current) use of aspirin: Secondary | ICD-10-CM | POA: Insufficient documentation

## 2020-06-05 DIAGNOSIS — R008 Other abnormalities of heart beat: Secondary | ICD-10-CM | POA: Insufficient documentation

## 2020-06-05 DIAGNOSIS — R0902 Hypoxemia: Secondary | ICD-10-CM | POA: Insufficient documentation

## 2020-06-05 DIAGNOSIS — Z79899 Other long term (current) drug therapy: Secondary | ICD-10-CM | POA: Insufficient documentation

## 2020-06-09 ENCOUNTER — Encounter (HOSPITAL_BASED_OUTPATIENT_CLINIC_OR_DEPARTMENT_OTHER): Payer: Self-pay | Admitting: Cardiovascular Disease

## 2020-06-09 NOTE — Procedures (Signed)
Patient Name: [name], Tith Date: 06/05/2020 Gender: Male D.O.B: 1950/11/21 Age (years): 30 Referring Provider: Nicki Guadalajara MD, ABSM Height (inches): 72 Interpreting Physician: Nicki Guadalajara MD, ABSM Weight (lbs): 307 RPSGT: Cherylann Parr BMI: 42 MRN: [id number] Neck Size: 18.00  CLINICAL INFORMATION Sleep Study Type: NPSG  Indication for sleep study: Snoring, Witnesses Apnea / Gasping During Sleep  Epworth Sleepiness Score: 17  Prior sleep study: July 2016: AHI 25.4/h; O2 nadir 64%.  SLEEP STUDY TECHNIQUE As per the AASM Manual for the Scoring of Sleep and Associated Events v2.3 (April 2016) with a hypopnea requiring 4% desaturations.  The channels recorded and monitored were frontal, central and occipital EEG, electrooculogram (EOG), submentalis EMG (chin), nasal and oral airflow, thoracic and abdominal wall motion, anterior tibialis EMG, snore microphone, electrocardiogram, and pulse oximetry.  MEDICATIONS albuterol (VENTOLIN HFA) 108 (90 Base) MCG/ACT inhaler aspirin (BAYER LOW STRENGTH) 81 MG EC tablet azelastine (ASTELIN) 0.1 % nasal spray cyanocobalamin 1000 MCG tablet diclofenac (VOLTAREN) 75 MG EC tablet ezetimibe (ZETIA) 10 MG tablet famotidine (PEPCID) 10 MG tablet fluticasone (CUTIVATE) 0.05 % cream fluticasone (FLONASE) 50 MCG/ACT nasal spray montelukast (SINGULAIR) 10 MG tablet Multiple Vitamins-Minerals (MACULAR VITAMIN BENEFIT PO) propranolol (INDERAL) 10 MG tablet rosuvastatin (CRESTOR) 10 MG tablet tadalafil (CIALIS) 20 MG tablet telmisartan (MICARDIS) 80 MG tablet traMADol (ULTRAM) 50 MG tablet  Medications self-administered by patient taken the night of the study : N/A  SLEEP ARCHITECTURE The study was initiated at 10:14:35 PM and ended at 5:06:07 AM.  Sleep onset time was 0.9 minutes and the sleep efficiency was 95.0%%. The total sleep time was 391 minutes.  Stage REM latency was 80.0 minutes.  The patient spent 3.6%% of the  night in stage N1 sleep, 79.0%% in stage N2 sleep, 0.0%% in stage N3 and 17.4% in REM.  Alpha intrusion was absent.  Supine sleep was 78.95%.  RESPIRATORY PARAMETERS The overall apnea/hypopnea index (AHI) was 32.2 per hour. The respiratory disturbance index (RDI) was 32.5/h. There were 151 total apneas, including 151 obstructive, 0 central and 0 mixed apneas. There were 59 hypopneas and 2 RERAs.  The AHI during Stage REM sleep was 56.5 per hour.  AHI while supine was 36.7 per hour.  The mean oxygen saturation was 91.5%. The minimum SpO2 during sleep was 69.0%.  Loud snoring was noted during this study.  CARDIAC DATA The 2 lead EKG demonstrated sinus rhythm. The mean heart rate was 54.6 beats per minute. Other EKG findings include: PVCs.  LEG MOVEMENT DATA The total PLMS were 0 with a resulting PLMS index of 0.0. Associated arousal with leg movement index was 0.0 .  IMPRESSIONS - Severe obstructive sleep apnea occurred during this study (AHI 32.2/h; RDI 32.5/h); events were worse with supine sleep (AHI 36.7/h) and during REM sleep (AHI 56.5/h). - No significant central sleep apnea occurred during this study (CAI = 0.0/h). - Severe oxygen desaturation to a nadir of 76% with NREM and 69.0% during REM sleep. - The patient snored with loud snoring volume. - EKG findings include occasional to frequent PVCs, most prominent during REM sleep with episodes of transient ventricular bigeminy and trigeminy during REM sleep. - Clinically significant periodic limb movements did not occur during sleep. No significant associated arousals.  DIAGNOSIS - Obstructive Sleep Apnea (G47.33) - Nocturnal Hypoxemia (G47.36)  RECOMMENDATIONS - Therapeutic CPAP titration to determine optimal pressure required to alleviate sleep disordered breathing. - Effort should be made to optimize nasal and oropharyngeal patency. - Positional therapy avoiding  supine position during sleep. - Avoid alcohol, sedatives and  other CNS depressants that may worsen sleep apnea and disrupt normal sleep architecture. - Sleep hygiene should be reviewed to assess factors that may improve sleep quality. - Weight management (BMI 42) and regular exercise should be initiated or continued if appropriate.  [Electronically signed] 06/09/2020 10:50 AM  Nicki Guadalajara MD, [facility], ABSM Diplomate, American Board of Sleep Medicine   NPI: [id number] [facility] PH: [phone]   FX: [phone] ACCREDITED BY THE AMERICAN ACADEMY OF SLEEP MEDICINE

## 2020-06-20 ENCOUNTER — Telehealth: Payer: Self-pay | Admitting: *Deleted

## 2020-06-20 NOTE — Telephone Encounter (Signed)
Informed patient per [employer] he has a older modeled CPAP machine that is not remote. Per Dr Tresa Endo he wants him to proceed with getting the CPAP titration sleep study. Patient voiced understanding and agrees with Dr Landry Dyke recommendations.

## 2020-06-23 ENCOUNTER — Other Ambulatory Visit: Payer: Self-pay | Admitting: Cardiovascular Disease

## 2020-06-23 ENCOUNTER — Telehealth: Payer: Self-pay | Admitting: *Deleted

## 2020-06-23 DIAGNOSIS — Z9989 Dependence on other enabling machines and devices: Secondary | ICD-10-CM

## 2020-06-23 DIAGNOSIS — IMO0002 Reserved for concepts with insufficient information to code with codable children: Secondary | ICD-10-CM

## 2020-06-23 NOTE — Telephone Encounter (Signed)
PA request for CPAP ?BIPAP titration sent to [facility] via web portal.

## 2020-06-23 NOTE — Telephone Encounter (Signed)
Ok to schedule: Approval came from [employer]), valid dates 04/21/20 -07-20-20 auth # [id number] & [phone]

## 2020-06-25 ENCOUNTER — Telehealth: Payer: Self-pay | Admitting: *Deleted

## 2020-06-25 NOTE — Telephone Encounter (Signed)
Patient notified of CPAP titration appointment. 

## 2020-07-03 ENCOUNTER — Other Ambulatory Visit: Payer: Self-pay

## 2020-07-03 ENCOUNTER — Ambulatory Visit (HOSPITAL_BASED_OUTPATIENT_CLINIC_OR_DEPARTMENT_OTHER): Payer: PPO | Attending: Cardiovascular Disease | Admitting: Cardiovascular Disease

## 2020-07-03 DIAGNOSIS — Z791 Long term (current) use of non-steroidal anti-inflammatories (NSAID): Secondary | ICD-10-CM | POA: Diagnosis not present

## 2020-07-03 DIAGNOSIS — Z9989 Dependence on other enabling machines and devices: Secondary | ICD-10-CM | POA: Diagnosis not present

## 2020-07-03 DIAGNOSIS — Z7982 Long term (current) use of aspirin: Secondary | ICD-10-CM | POA: Insufficient documentation

## 2020-07-03 DIAGNOSIS — R0902 Hypoxemia: Secondary | ICD-10-CM | POA: Insufficient documentation

## 2020-07-03 DIAGNOSIS — G4733 Obstructive sleep apnea (adult) (pediatric): Secondary | ICD-10-CM | POA: Insufficient documentation

## 2020-07-03 DIAGNOSIS — IMO0002 Reserved for concepts with insufficient information to code with codable children: Secondary | ICD-10-CM

## 2020-07-03 DIAGNOSIS — Z79899 Other long term (current) drug therapy: Secondary | ICD-10-CM | POA: Diagnosis not present

## 2020-07-03 DIAGNOSIS — G4736 Sleep related hypoventilation in conditions classified elsewhere: Secondary | ICD-10-CM

## 2020-07-09 ENCOUNTER — Encounter (HOSPITAL_BASED_OUTPATIENT_CLINIC_OR_DEPARTMENT_OTHER): Payer: Self-pay | Admitting: Cardiovascular Disease

## 2020-07-09 NOTE — Procedures (Signed)
Patient Name: [name], [name] Date: 07/03/2020 Gender: Male D.O.B: 08-06-1951 Age (years): 75 Referring Provider: Nicki Guadalajara MD, ABSM Height (inches): 72 Interpreting Physician: Nicki Guadalajara MD, ABSM Weight (lbs): 311 RPSGT: Armen Pickup BMI: 42 MRN: [id number] Neck Size: 18.00  CLINICAL INFORMATION The patient is referred for a CPAP titration to treat sleep apnea.  Date of NPSG:  06/05/2020: AHI 32.2/h; RDI 32.5/h; REM AHI 56.5/h with frequent PVC's during REM sleep; O2 nadir 69%; .  SLEEP STUDY TECHNIQUE As per the AASM Manual for the Scoring of Sleep and Associated Events v2.3 (April 2016) with a hypopnea requiring 4% desaturations.  The channels recorded and monitored were frontal, central and occipital EEG, electrooculogram (EOG), submentalis EMG (chin), nasal and oral airflow, thoracic and abdominal wall motion, anterior tibialis EMG, snore microphone, electrocardiogram, and pulse oximetry. Continuous positive airway pressure (CPAP) was initiated at the beginning of the study and titrated to treat sleep-disordered breathing.  MEDICATIONS albuterol (VENTOLIN HFA) 108 (90 Base) MCG/ACT inhaler aspirin (BAYER LOW STRENGTH) 81 MG EC tablet azelastine (ASTELIN) 0.1 % nasal spray cyanocobalamin 1000 MCG tablet diclofenac (VOLTAREN) 75 MG EC tablet ezetimibe (ZETIA) 10 MG tablet famotidine (PEPCID) 10 MG tablet fluticasone (CUTIVATE) 0.05 % cream fluticasone (FLONASE) 50 MCG/ACT nasal spray montelukast (SINGULAIR) 10 MG tablet Multiple Vitamins-Minerals (MACULAR VITAMIN BENEFIT PO) propranolol (INDERAL) 10 MG tablet rosuvastatin (CRESTOR) 10 MG tablet tadalafil (CIALIS) 20 MG tablet telmisartan (MICARDIS) 80 MG tablet traMADol (ULTRAM) 50 MG tablet  Medications self-administered by patient taken the night of the study : N/A  TECHNICIAN COMMENTS Comments added by technician: Patient had difficulty initiating sleep. Patient was restless all through the  night. ONE REST ROOM VISTED Comments added by scorer: N/A  RESPIRATORY PARAMETERS Optimal PAP Pressure (cm): 16 AHI at Optimal Pressure (/hr): 0.0 Overall Minimal O2 (%): 73.0 Supine % at Optimal Pressure (%): 0 Minimal O2 at Optimal Pressure (%): 94.0   SLEEP ARCHITECTURE The study was initiated at 11:03:08 PM and ended at 5:36:42 AM.  Sleep onset time was 9.2 minutes and the sleep efficiency was 81.6%%. The total sleep time was 321 minutes.  The patient spent 4.8%% of the night in stage N1 sleep, 77.7%% in stage N2 sleep, 0.2%% in stage N3 and 17.3% in REM.Stage REM latency was 64.0 minutes  Wake after sleep onset was 63.4. Alpha intrusion was absent. Supine sleep was 48.44%.  CARDIAC DATA The 2 lead EKG demonstrated sinus rhythm. The mean heart rate was 53.8 beats per minute. Other EKG findings include: PVCs.  LEG MOVEMENT DATA The total Periodic Limb Movements of Sleep (PLMS) were 0. The PLMS index was 0.0. A PLMS index of <15 is considered normal in adults.  IMPRESSIONS - CPAP was initiated at 6 cm and was titrated to optimal PAP pressure at 16 cm of water; AHI 0; O2 nadir 94%, but REM sleep did not occur at 16 cm.  AHI at 15 cm with REM sleep was elevated at 23.3/h. - Central sleep apnea was not noted during this titration (CAI = 0.0/h). - Severe oxygen desaturations to a nadir of 73.0% at 12 cm of water with REM sleep. - No snoring was audible during this study. - 2-lead EKG demonstrated: PVCs - Clinically significant periodic limb movements were not noted during this study. Arousals associated with PLMs were rare.  DIAGNOSIS - Obstructive Sleep Apnea (G47.33) - Nocturnal hypoxemia  RECOMMENDATIONS - Recommend an initial trial of CPAP Auto therapy with EPR of 3 at 16 -20 cm  H2O with heated humidification.  A Large size Philips Respironics Nasal Pillow Mask Nuance Pro Gel mask was used for the titration. - Effort should be made to optimize nasal and oropharyngeal  patency. - Avoid alcohol, sedatives and other CNS depressants that may worsen sleep apnea and disrupt normal sleep architecture. - Sleep hygiene should be reviewed to assess factors that may improve sleep quality. - Weight management (BMI 42) and regular exercise should be initiated or continued. - Recommend a download in 30 days and sleep Center evaluation after 4 weeks of therapy.   [Electronically signed] 07/09/2020 05:15 PM  Nicki Guadalajara MD, [facility], ABSM Diplomate, American Board of Sleep Medicine   NPI: [id number] [facility] PH: [phone]   FX: [phone] ACCREDITED BY THE AMERICAN ACADEMY OF SLEEP MEDICINE

## 2020-07-10 ENCOUNTER — Telehealth: Payer: Self-pay | Admitting: *Deleted

## 2020-07-10 NOTE — Telephone Encounter (Signed)
Order for CPAP machine sent to [facility].

## 2020-07-23 ENCOUNTER — Telehealth: Payer: Self-pay | Admitting: Cardiovascular Disease

## 2020-07-23 NOTE — Telephone Encounter (Signed)
    Pt is calling to get an update about his CPAP machine. staff message sent to Burna Mortimer to call pt

## 2020-07-24 ENCOUNTER — Telehealth: Payer: Self-pay | Admitting: *Deleted

## 2020-07-24 NOTE — Telephone Encounter (Signed)
Contacted Jasmine December with [facility] to check the status of the patient's CPAP machine order. Informed her the patient has called our office. She tells me that she " literally just got off of the phone with him." she states that she has not gotten his PA back from his insurance company. She informed the patient that she should be hearing back from them any day now and calling him for set up appointment.

## 2020-08-06 DIAGNOSIS — Z23 Encounter for immunization: Secondary | ICD-10-CM | POA: Diagnosis not present

## 2020-08-08 ENCOUNTER — Encounter: Payer: Self-pay | Admitting: Cardiovascular Disease

## 2020-08-08 ENCOUNTER — Telehealth: Payer: Self-pay

## 2020-08-08 ENCOUNTER — Telehealth (INDEPENDENT_AMBULATORY_CARE_PROVIDER_SITE_OTHER): Payer: PPO | Admitting: Cardiovascular Disease

## 2020-08-08 VITALS — BP 130/64 | HR 56 | Wt 315.0 lb

## 2020-08-08 DIAGNOSIS — I10 Essential (primary) hypertension: Secondary | ICD-10-CM | POA: Diagnosis not present

## 2020-08-08 DIAGNOSIS — G4733 Obstructive sleep apnea (adult) (pediatric): Secondary | ICD-10-CM

## 2020-08-08 DIAGNOSIS — I493 Ventricular premature depolarization: Secondary | ICD-10-CM

## 2020-08-08 DIAGNOSIS — R931 Abnormal findings on diagnostic imaging of heart and coronary circulation: Secondary | ICD-10-CM | POA: Diagnosis not present

## 2020-08-08 DIAGNOSIS — E785 Hyperlipidemia, unspecified: Secondary | ICD-10-CM | POA: Diagnosis not present

## 2020-08-08 NOTE — Progress Notes (Signed)
Virtual Visit via Video Note   This visit type was conducted due to national recommendations for restrictions regarding the COVID-19 Pandemic (e.g. social distancing) in an effort to limit this patient's exposure and mitigate transmission in our community.  Due to his co-morbid illnesses, this patient is at least at moderate risk for complications without adequate follow up.  This format is felt to be most appropriate for this patient at this time.  All issues noted in this document were discussed and addressed.  A limited physical exam was performed with this format.  Please refer to the patient's chart for his consent to telehealth for [facility].       Date:  08/08/2020   ID:  [name], DOB [birth year], MRN [id number] The patient was identified using 2 identifiers.  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  He was last evaluated in June 2021 in a telemedicine encounter and presents now for follow-up cardiology/sleep evaluation following undergoing sleep evaluations.  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 for primary care and most recently sees Dr. [name].He admits to a history of hypertension and most recently has been on telmisartan which he takes 40 mg daily since 2008. He has a history of hyperlipidemia review of prior records that he brought with him today to his office visit, in 1994 his LDL cholesterol was 214. In 2006 he had undergone Lipomed NMRevaluation in my review of this data today demonstrates significant increased LDL small particles at 1907 with markedly increased levels of LDL  particles at 2954. Apparently had been on treatment with Crestor but did experience some myalgias. More recently he has been taking 10 mg 3 times per week but he had been off Crestor for 8 to 9 months. Review of echo Doppler data from 2008 revealed normal systolic function with EF at 65% with grade 1 diastolic dysfunction.[id number] he was hospitalizedandwas significantly hypertensive. He underwent cardiac catheterization Dr. Dickie La and was not told of having obstructive disease. His last echo was done in 2016 which also showed an EF of 60 to 65%. There was moderate biatrial enlargement.   Remotely, the patient had seen Dr. Porfirio Mylar Dohmeier and was diagnosed with struct of sleep apnea. He underwent an initial sleep study on May 08, 2015 at which time he weighed 324 pounds and BMI was 41.6. Sleep apnea was moderate with an AHI of 25.4 and was strongly supine dependent. He had significant oxygen desaturation to a nadir of 64%. He last saw Dr. Vickey Huger in 2019. At that time his 95th percentile CPAP pressure was 9.2 cm. Over the past year and a half, unfortunately his wife was diagnosed with cancer and has been undergoing treatments. He admits to a 35 pound weight loss since January 2020. He has not used CPAP in over a year. On questioning today, he tells me that he does have snoring. At times he can find himself falling asleepifcircumstances persist. His sleep is not as restorative as it had been previously andhis wife has noticed some apneic events. In the office today I did obtain an Epworth Sleepiness Scale score which endorsed at 12 consistent with excessive daytime  sleepiness.  He had  seen Dr. Prince Solian laboratory from November 30, 2019 showed a total cholesterol of 230, triglycerides 146, HDL 48, LDL 153. Apolipoprotein B was 117. BUN 18 creatinine 1.0. He was referred for a coronary calcium score was done on December 14, 2019 which revealed a total Agatsonscore of 278 with left  main calcium 33, LAD 151, left circumflex 38, and RCA 56. He was also noted to have aortic atherosclerosis and mild calcification of his aortic valve was detected. Roe Coombs denies any change in symptoms status. He specifically denies any chest pain or any exertional dyspnea. He denies any change in exercise tolerance. He walks his dog daily at a fast pace and has not noticed any decline in symptoms. He had called me following his results and now presents for initial cardiology evaluation. Following his calcium score findings, Crestor was recently increased to 20 mg daily.  During my initial evaluation there is elevated Agaston score of 278 with calcification involving his coronary arteries I recommended the addition of Zetia to his rosuvastatin 20 mg.  I also recommended that he undergo a 2D echo Doppler study to further evaluate his aortic systolic murmur.  This was done on 12/21/2019 which revealed normal systolic function with grade 2 diastolic dysfunction.  His left atrium was mildly dilated.  There was mild mitral regurgitation and mild to moderate aortic sclerosis/calcification without stenosis.   Laboratory on April 14, 2020 marked improvement in lipid studies with total cholesterol 120, triglycerides 83, HDL 53, and LDL 51.  He has tolerated the combination of Crestor 20 mg and Zetia without side effects.    When he was evaluated in a telemedicine visit on April 16, 2020 he was asymptomatic with reference to chest pain or shortness of breath or palpitations.  He was never able to undergo a sleep study due to the restrictions of the Covid testing and his work Counselling psychologist.  He would like to pursue this evaluation since his machine is old and he believes he would benefit from reinitiation of therapy.   He underwent diagnostic sleep study on June 05, 2020.  Symptoms included snoring, witnessed apnea, gasping for breath during sleep and daytime sleepiness.  He was found to have severe obstructive  sleep apnea with an AHI of 32.2/h.  Events were more severe with supine sleep with an AHI of 36.7/h and during REM sleep with an AHI 56.5/h.  There was no central apnea.  He had severe oxygen desaturation to a nadir of 76% with non-REM sleep and to 69% during REM sleep.  He was also found to have occasional to frequent PVCs which were most prominent during REM sleep and he developed transient episodes of ventricular bigeminy and trigeminy during REM sleep.  He was referred for a CPAP titration study which was done on July 03, 2020.  CPAP was initiated at 6 cm and was titrated to 16 cm water pressure.  Of note, AHI at 16 was 0 but he did not achieve REM sleep.  At 15 cm water pressure, AHI was elevated at 23.3 NREM sleep developed.  He was recommended that he start CPAP auto therapy with an EPR of 3 at a range of 16 to 20 cm of water pressure.  Apparently, due to the patient's wife's illness he had to cancel his appointment with set up with [facility].  Since he had had a prior machine, apparently this also was a ResMed air sense 10.  He has been using his old  machine and he believes the settings are auto mode at a range of 5-15.  While waiting for his adjustment with [facility] he has been using this old machine with the old settings.  He has noticed a dramatic benefit in his sleep quality.  He is no longer falling asleep during the day at times while having conversations.  He is sleeping much better.  He is more alert.  He is now dreaming.  He has continued to take propranolol low-dose 10 mg twice a day for his hand tremor and continues to be on telmisartan 40 mg for blood pressure control.  He states his blood pressure has been excellent.  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  He typically goes to bed between 1030 and 1130 and wakes up between 6 and 630.  However, prior to reinitiating CPAP he often was getting up around 2:00 and not sleeping well  thereafter.  The patient does not have symptoms concerning for COVID-19 infection (fever, chills, cough, or new shortness of breath).    Past Medical History:  Diagnosis Date  . Non-cardiac chest pain 2008   and arm pain; Dr. Charlies Constable.  . Other and unspecified hyperlipidemia    NMR 2006: LDL 206(2954/1907), HDL 43, TG 82. LDL goal = <100. Framingham study LDL goal =  < 130   Past Surgical History:  Procedure Laterality Date  . CARDIAC CATHETERIZATION  2008   negative; Dr Juanda Chance  . COLONOSCOPY  2009   negative; Dr Arlyce Dice  . I&D L knee  2009   for post traumatic cellulitis/abscess  . TONSILLECTOMY       Current Meds  Medication Sig  . albuterol (VENTOLIN HFA) 108 (90 Base) MCG/ACT inhaler Inhale into the lungs.  Marland Kitchen aspirin (BAYER LOW STRENGTH) 81 MG EC tablet Take 81 mg by mouth daily.    Marland Kitchen azelastine (ASTELIN) 0.1 % nasal spray Place into the nose.  . cyanocobalamin 1000 MCG tablet Take by mouth.  . diclofenac (VOLTAREN) 75 MG EC tablet   . ezetimibe (ZETIA) 10 MG tablet Take 10 mg by mouth daily.  . famotidine (PEPCID) 10 MG tablet Take by mouth.  . fluticasone (CUTIVATE) 0.05 % cream   . fluticasone (FLONASE) 50 MCG/ACT nasal spray USE 1 SPRAY IN EACH NOSTRIL TWICE A DAY.  . montelukast (SINGULAIR) 10 MG tablet Take by mouth.  . Multiple Vitamins-Minerals (MACULAR VITAMIN BENEFIT PO) Take by mouth daily.    . propranolol (INDERAL) 10 MG tablet Take 1 tablet (10 mg total) by mouth 2 (two) times daily.  . rosuvastatin (CRESTOR) 10 MG tablet Take 20 mg by mouth daily.   Marland Kitchen telmisartan (MICARDIS) 80 MG tablet Take 1 tablet (80 mg total) by mouth daily. (Patient taking differently: Take 40 mg by mouth daily. )  . traMADol (ULTRAM) 50 MG tablet      Allergies:   Metoprolol succinate and Breo ellipta [fluticasone furoate-vilanterol]   Social History   Tobacco Use  . Smoking status: Never Smoker  . Smokeless tobacco: Never Used  Substance Use Topics  . Alcohol use: Yes     Alcohol/week: 9.0 standard drinks    Types: 9 Glasses of wine per week    Comment: socially  . Drug use: Not on file     Family Hx: The patient's family history includes Coronary artery disease in an other family member; Dementia in his father; Glaucoma in his father; Heart attack  in his cousin, maternal grandfather, and maternal uncle; Pancreatic cancer in his father; Prostate cancer in his father; Stroke (age of onset: 30) in his maternal grandfather. There is no history of Asthma or Diabetes.  ROS:   Please see the history of present illness.    No fevers chills night sweats No cough Arthritic issues involving neck, back, knees, and ankle No awareness of palpitations No abdominal pain No leg swelling Sleeping better since initiation of CPAP therapy All other systems reviewed and are negative.   Prior CV studies:   The following studies were reviewed today:  ECHO 01/18/2020 IMPRESSIONS  1. Left ventricular ejection fraction, by estimation, is 60 to 65%. The  left ventricle has normal function. The left ventricle has no regional  wall motion abnormalities. There is mild concentric left ventricular  hypertrophy. Left ventricular diastolic  parameters are consistent with Grade II diastolic dysfunction  (pseudonormalization).  2. Right ventricular systolic function is normal. The right ventricular  size is normal.  3. Left atrial size was mildly dilated.  4. The mitral valve is normal in structure. Mild mitral valve  regurgitation. No evidence of mitral stenosis.  5. The aortic valve is normal in structure. Aortic valve regurgitation is  not visualized. Mild to moderate aortic valve sclerosis/calcification is  present, without any evidence of aortic stenosis.  6. The inferior vena cava is normal in size with greater than 50%  respiratory variability, suggesting right atrial pressure of 3 mmHg.   PSG: 06/05/2020 CLINICAL INFORMATION Sleep Study Type: NPSG  Indication  for sleep study: Snoring, Witnesses Apnea / Gasping During Sleep  Epworth Sleepiness Score: 17  Prior sleep study: July 2016: AHI 25.4/h; O2 nadir 64%.  SLEEP STUDY TECHNIQUE As per the AASM Manual for the Scoring of Sleep and Associated Events v2.3 (April 2016) with a hypopnea requiring 4% desaturations.  The channels recorded and monitored were frontal, central and occipital EEG, electrooculogram (EOG), submentalis EMG (chin), nasal and oral airflow, thoracic and abdominal wall motion, anterior tibialis EMG, snore microphone, electrocardiogram, and pulse oximetry.  MEDICATIONS albuterol (VENTOLIN HFA) 108 (90 Base) MCG/ACT inhaler aspirin (BAYER LOW STRENGTH) 81 MG EC tablet azelastine (ASTELIN) 0.1 % nasal spray cyanocobalamin 1000 MCG tablet diclofenac (VOLTAREN) 75 MG EC tablet ezetimibe (ZETIA) 10 MG tablet famotidine (PEPCID) 10 MG tablet fluticasone (CUTIVATE) 0.05 % cream fluticasone (FLONASE) 50 MCG/ACT nasal spray montelukast (SINGULAIR) 10 MG tablet Multiple Vitamins-Minerals (MACULAR VITAMIN BENEFIT PO) propranolol (INDERAL) 10 MG tablet rosuvastatin (CRESTOR) 10 MG tablet tadalafil (CIALIS) 20 MG tablet telmisartan (MICARDIS) 80 MG tablet traMADol (ULTRAM) 50 MG tablet  Medications self-administered by patient taken the night of the study : N/A  SLEEP ARCHITECTURE The study was initiated at 10:14:35 PM and ended at 5:06:07 AM.  Sleep onset time was 0.9 minutes and the sleep efficiency was 95.0%%. The total sleep time was 391 minutes.  Stage REM latency was 80.0 minutes.  The patient spent 3.6%% of the night in stage N1 sleep, 79.0%% in stage N2 sleep, 0.0%% in stage N3 and 17.4% in REM.  Alpha intrusion was absent.  Supine sleep was 78.95%.  RESPIRATORY PARAMETERS The overall apnea/hypopnea index (AHI) was 32.2 per hour. The respiratory disturbance index (RDI) was 32.5/h. There were 151 total apneas, including 151 obstructive, 0 central and 0  mixed apneas. There were 59 hypopneas and 2 RERAs.  The AHI during Stage REM sleep was 56.5 per hour.  AHI while supine was 36.7 per hour.  The mean oxygen saturation was  91.5%. The minimum SpO2 during sleep was 69.0%.  Loud snoring was noted during this study.  CARDIAC DATA The 2 lead EKG demonstrated sinus rhythm. The mean heart rate was 54.6 beats per minute. Other EKG findings include: PVCs.  LEG MOVEMENT DATA The total PLMS were 0 with a resulting PLMS index of 0.0. Associated arousal with leg movement index was 0.0 .  IMPRESSIONS - Severe obstructive sleep apnea occurred during this study (AHI 32.2/h; RDI 32.5/h); events were worse with supine sleep (AHI 36.7/h) and during REM sleep (AHI 56.5/h). - No significant central sleep apnea occurred during this study (CAI = 0.0/h). - Severe oxygen desaturation to a nadir of 76% with NREM and 69.0% during REM sleep. - The patient snored with loud snoring volume. - EKG findings include occasional to frequent PVCs, most prominent during REM sleep with episodes of transient ventricular bigeminy and trigeminy during REM sleep. - Clinically significant periodic limb movements did not occur during sleep. No significant associated arousals.  DIAGNOSIS - Obstructive Sleep Apnea (G47.33) - Nocturnal Hypoxemia (G47.36)  RECOMMENDATIONS - Therapeutic CPAP titration to determine optimal pressure required to alleviate sleep disordered breathing. - Effort should be made to optimize nasal and oropharyngeal patency. - Positional therapy avoiding supine position during sleep. - Avoid alcohol, sedatives and other CNS depressants that may worsen sleep apnea and disrupt normal sleep architecture. - Sleep hygiene should be reviewed to assess factors that may improve sleep quality. - Weight management (BMI 42) and regular exercise should be initiated or continued if appropriate.  CPAP TITRATION: 07/03/2020 CLINICAL INFORMATION The patient is  referred for a CPAP titration to treat sleep apnea.  Date of NPSG:  06/05/2020: AHI 32.2/h; RDI 32.5/h; REM AHI 56.5/h with frequent PVC's during REM sleep; O2 nadir 69%; .  SLEEP STUDY TECHNIQUE As per the AASM Manual for the Scoring of Sleep and Associated Events v2.3 (April 2016) with a hypopnea requiring 4% desaturations.  The channels recorded and monitored were frontal, central and occipital EEG, electrooculogram (EOG), submentalis EMG (chin), nasal and oral airflow, thoracic and abdominal wall motion, anterior tibialis EMG, snore microphone, electrocardiogram, and pulse oximetry. Continuous positive airway pressure (CPAP) was initiated at the beginning of the study and titrated to treat sleep-disordered breathing.  MEDICATIONS albuterol (VENTOLIN HFA) 108 (90 Base) MCG/ACT inhaler aspirin (BAYER LOW STRENGTH) 81 MG EC tablet azelastine (ASTELIN) 0.1 % nasal spray cyanocobalamin 1000 MCG tablet diclofenac (VOLTAREN) 75 MG EC tablet ezetimibe (ZETIA) 10 MG tablet famotidine (PEPCID) 10 MG tablet fluticasone (CUTIVATE) 0.05 % cream fluticasone (FLONASE) 50 MCG/ACT nasal spray montelukast (SINGULAIR) 10 MG tablet Multiple Vitamins-Minerals (MACULAR VITAMIN BENEFIT PO) propranolol (INDERAL) 10 MG tablet rosuvastatin (CRESTOR) 10 MG tablet tadalafil (CIALIS) 20 MG tablet telmisartan (MICARDIS) 80 MG tablet traMADol (ULTRAM) 50 MG tablet  Medications self-administered by patient taken the night of the study : N/A  TECHNICIAN COMMENTS Comments added by technician: Patient had difficulty initiating sleep. Patient was restless all through the night. ONE REST ROOM VISTED Comments added by scorer: N/A  RESPIRATORY PARAMETERS Optimal PAP Pressure (cm):  16        AHI at Optimal Pressure (/hr):            0.0 Overall Minimal O2 (%):         73.0     Supine % at Optimal Pressure (%):    0 Minimal O2 at Optimal Pressure (%): 94.0       SLEEP ARCHITECTURE The study was initiated  at 11:03:08 PM  and ended at 5:36:42 AM.  Sleep onset time was 9.2 minutes and the sleep efficiency was 81.6%%. The total sleep time was 321 minutes.  The patient spent 4.8%% of the night in stage N1 sleep, 77.7%% in stage N2 sleep, 0.2%% in stage N3 and 17.3% in REM.Stage REM latency was 64.0 minutes  Wake after sleep onset was 63.4. Alpha intrusion was absent. Supine sleep was 48.44%.  CARDIAC DATA The 2 lead EKG demonstrated sinus rhythm. The mean heart rate was 53.8 beats per minute. Other EKG findings include: PVCs.  LEG MOVEMENT DATA The total Periodic Limb Movements of Sleep (PLMS) were 0. The PLMS index was 0.0. A PLMS index of <15 is considered normal in adults.  IMPRESSIONS - CPAP was initiated at 6 cm and was titrated to optimal PAP pressure at 16 cm of water; AHI 0; O2 nadir 94%, but REM sleep did not occur at 16 cm.  AHI at 15 cm with REM sleep was elevated at 23.3/h. - Central sleep apnea was not noted during this titration (CAI = 0.0/h). - Severe oxygen desaturations to a nadir of 73.0% at 12 cm of water with REM sleep. - No snoring was audible during this study. - 2-lead EKG demonstrated: PVCs - Clinically significant periodic limb movements were not noted during this study. Arousals associated with PLMs were rare.  DIAGNOSIS - Obstructive Sleep Apnea (G47.33) - Nocturnal hypoxemia  RECOMMENDATIONS - Recommend an initial trial of CPAP Auto therapy with EPR of 3 at 16 -20 cm H2O with heated humidification.  A Large size Philips Respironics Nasal Pillow Mask Nuance Pro Gel mask was used for the titration. - Effort should be made to optimize nasal and oropharyngeal patency. - Avoid alcohol, sedatives and other CNS depressants that may worsen sleep apnea and disrupt normal sleep architecture. - Sleep hygiene should be reviewed to assess factors that may improve sleep quality. - Weight management (BMI 42) and regular exercise should be initiated or continued. -  Recommend a download in 30 days and sleep Center evaluation after 4 weeks of therapy.   Labs/Other Tests and Data Reviewed:    EKG: I personally reviewed his ECG from December 28, 2019 which showed normal sinus rhythm at 60 bpm, first-degree AV block, small Q-wave in lead III without significant ST changes.  PR interval 250 ms, QTc interval 456 ms.  Recent Labs: 04/14/2020: ALT 26; BUN 18; Creatinine, Ser 1.20; Potassium 4.6; Sodium 141   Recent Lipid Panel Lab Results  Component Value Date/Time   CHOL 120 04/14/2020 08:17 AM   CHOL 231 (H) 01/28/2015 08:01 AM   TRIG 83 04/14/2020 08:17 AM   TRIG 154 (H) 01/28/2015 08:01 AM   TRIG 97 08/30/2006 07:36 AM   HDL 53 04/14/2020 08:17 AM   HDL 51 01/28/2015 08:01 AM   CHOLHDL 2.3 04/14/2020 08:17 AM   CHOLHDL 5 01/28/2015 08:01 AM   LDLCALC 51 04/14/2020 08:17 AM   LDLCALC 149 (H) 01/28/2015 08:01 AM   LDLDIRECT 143.4 11/21/2012 08:01 AM    Wt Readings from Last 3 Encounters:  08/08/20 (!) 315 lb (142.9 kg)  07/03/20 (!) 311 lb (141.1 kg)  06/05/20 (!) 307 lb (139.3 kg)      Objective:    Vital Signs:  BP 130/64   Pulse (!) 56   Wt (!) 315 lb (142.9 kg)   BMI 40.44 kg/m    Visually, he appeared unchanged in physical appearance Breathing was normal and not labored I did not hear audible  wheezing He was unaware of rhythm abnormality. He had issues with arthritis involving his knees, back, neck No awareness of leg swelling Normal affect and mood  ASSESSMENT & PLAN:    1. OSA: I thoroughly reviewed his reevaluation for sleep apnea.  Roe Coombs had previously been diagnosed with moderately severe sleep apnea in 2006 and at that time had significant oxygen desaturation to a nadir of 64%.  On his most recent repeat evaluation, this confirmed severe sleep apnea that was very severe during REM sleep with an AHI of 32.2/h and REM AHI of 56.5/h.  He had occasional PVCs throughout the study and PVCs became more frequent with development of  transient bigeminal and trigeminal rhythm during REM sleep most likely when he was significantly hypoxic.  On his most recent CPAP titration trial, he required high pressure for stabilization and although AHI was 0 at 16 cm of water, REM sleep did not occur and for that reason I recommended auto therapy with a range of 16-20.  Apparently he was not set up with his new pressures but states that he started to use his old CPAP machine which he believes is set at a range of 5-15 while awaiting rescheduling of his set up date.  He has noticed marked benefit since reinitiating CPAP.  He believes he is not requiring high pressure but unfortunately his old machine is not linked to our office so I could not obtain data.  I contacted Victorino Dike a [facility] who will be his DME company.  Since he is feeling well on pressures which are lower than my initial recommendation I will have her set his new machine to an auto mode with a pressure range of 11-20.  Once we obtain a download and see with his 95th percentile pressure is we can then make further adjustments.  He did not tolerate the initial mask that was used during his titration study I do feel he would benefit from the ResMed AirFit N 30i mask since he does not feel comfortable with full facemask.  I discussed sleep hygiene issues. 2. Essential hypertension: Blood pressure continues to be stable on telmisartan 40 mg. 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  He was noted to have increased PVCs during REM sleep on his diagnostic polysomnogram most likely contributed by significant nocturnal oxygen desaturation secondary to his sleep disordered breathing.  Beta-blocker titration may be necessary. 4. Hyperlipidemia: February 2021 laboratory revealed a total cholesterol 230, triglycerides 146, HDL 48, LDL 153.  He is now back on statin therapy with rosuvastatin increased to 20 mg daily and therapy  with Zetia.  Subsequent laboratory in June 2021 showed marked improvement in LDL cholesterol at 51 on Zetia plus rosuvastatin 20 mg daily. 5. Calcium score 278: Now on rosuvastatin 20 mg plus Zetia with marked improvement in LDL cholesterol at 51.  No anginal symptoms.  Remote cardiac catheterization in 2008 by Dr. Dickie La without obstructive CAD. 6. Aortic murmur: Mild to moderate aortic sclerosis without stenosis.  Normal LV function with grade 2 diastolic dysfunction. 7. Obesity: BMI 40.4.  Weight loss and increased exercise recommended.  However exercise has been limited by his arthritic issues.  COVID-19 Education: The signs and symptoms of COVID-19 were discussed with the patient and how to seek care for testing (follow up with PCP or arrange E-visit). The importance of social distancing was discussed today.  Time:   Today, I have  spent 28 minutes with the patient with telehealth technology discussing the above problems.     Medication Adjustments/Labs and Tests Ordered: Current medicines are reviewed at length with the patient today.  Concerns regarding medicines are outlined above.   Tests Ordered: No orders of the defined types were placed in this encounter.   Medication Changes: No orders of the defined types were placed in this encounter.   Follow Up: Repeat chemistry and lipid studies will be obtained in December with plans for subsequent follow-up office visit.  Signed, Nicki Guadalajara, MD  08/08/2020 8:07 AM    [facility]

## 2020-08-08 NOTE — Patient Instructions (Signed)
Medication Instructions:  Your physician recommends that you continue on your current medications as directed. Please refer to the Current Medication list given to you today.  *If you need a refill on your cardiac medications before your next appointment, please call your pharmacy*  Lab Work: FASTING LP/CMET IN December   If you have labs (blood work) drawn today and your tests are completely normal, you will receive your results only by: Marland Kitchen MyChart Message (if you have MyChart) OR . A paper copy in the mail If you have any lab test that is abnormal or we need to change your treatment, we will call you to review the results.  Testing/Procedures: NONE   Follow-Up: At [facility], you and your health needs are our priority.  As part of our continuing mission to provide you with exceptional heart care, we have created designated Provider Care Teams.  These Care Teams include your primary Cardiologist (physician) and Advanced Practice Providers (APPs -  Physician Assistants and Nurse Practitioners) who all work together to provide you with the care you need, when you need it.  We recommend signing up for the patient portal called "MyChart".  Sign up information is provided on this After Visit Summary.  MyChart is used to connect with patients for Virtual Visits (Telemedicine).  Patients are able to view lab/test results, encounter notes, upcoming appointments, etc.  Non-urgent messages can be sent to your provider as well.   To learn more about what you can do with MyChart, go to [email or web address].    Your next appointment:   11/17/2020 at 9:00 am

## 2020-08-08 NOTE — Telephone Encounter (Signed)
Reached out to pt for rooming for virtual visit.

## 2020-08-09 ENCOUNTER — Encounter: Payer: Self-pay | Admitting: Cardiovascular Disease

## 2020-08-11 ENCOUNTER — Telehealth: Payer: Self-pay | Admitting: Cardiovascular Disease

## 2020-08-11 DIAGNOSIS — G4733 Obstructive sleep apnea (adult) (pediatric): Secondary | ICD-10-CM | POA: Diagnosis not present

## 2020-08-11 NOTE — Telephone Encounter (Signed)
Spoke with Jasmine December from [facility] who is calling to request an order from Dr. Tresa Endo for the patients updated sleep settings for his CPAP mask. Patient has changed the type of mask he wants. Jasmine December is requesting that Dr. Tresa Endo fax over an order with the patients name, DOB and diagnosis code. She states she will fill out the rest of the form with the patients specifications.

## 2020-08-11 NOTE — Telephone Encounter (Signed)
New message:     [name] from [facility] calling to let us know they are going to be making some changes to  His sleep order  Please call back.

## 2020-08-11 NOTE — Telephone Encounter (Signed)
Order form sent to [facility] at [facility].

## 2020-10-06 ENCOUNTER — Ambulatory Visit: Payer: PPO | Admitting: Podiatry

## 2020-10-06 ENCOUNTER — Ambulatory Visit: Payer: PPO | Admitting: Orthotics

## 2020-10-06 ENCOUNTER — Other Ambulatory Visit: Payer: Self-pay

## 2020-10-06 ENCOUNTER — Encounter: Payer: Self-pay | Admitting: Podiatry

## 2020-10-06 DIAGNOSIS — R2681 Unsteadiness on feet: Secondary | ICD-10-CM | POA: Diagnosis not present

## 2020-10-06 DIAGNOSIS — M779 Enthesopathy, unspecified: Secondary | ICD-10-CM

## 2020-10-06 NOTE — Progress Notes (Signed)
Repeat prevous order; dr. Charlsie Merles to put in charges.

## 2020-10-08 NOTE — Progress Notes (Signed)
Subjective:   Patient ID: [name], male   DOB: 69 y.o.   MRN: [id number]   HPI Patient presents stating his feet bother him when he walks and he needs to be ambulatory.  Patient does have moderate obesity which is a complicating factor to condition   Review of Systems  All other systems reviewed and are negative.       Objective:  Physical Exam Vitals and nursing note reviewed.  Constitutional:      Appearance: He is well-developed and well-nourished.  Cardiovascular:     Pulses: Intact distal pulses.  Pulmonary:     Effort: Pulmonary effort is normal.  Musculoskeletal:        General: Normal range of motion.  Skin:    General: Skin is warm.  Neurological:     Mental Status: He is alert.     Neurovascular status intact with patient found to have excessive plantar pressure on his feet with inflammation of his forefoot bilateral localized with orthotics which have been beneficial in the past but they have worn out are no longer providing support.  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  Patient wants to go this course and will be seen back when ready and is working with Dennie Bible orthotist to have these made properly

## 2020-10-14 DIAGNOSIS — Z20822 Contact with and (suspected) exposure to covid-19: Secondary | ICD-10-CM | POA: Diagnosis not present

## 2020-10-29 ENCOUNTER — Telehealth: Payer: Self-pay | Admitting: Cardiovascular Disease

## 2020-10-29 NOTE — Telephone Encounter (Signed)
Patient called to talk with Dr. Tresa Endo or nurse regarding issues he's having with his cpap machine. Please call back

## 2020-11-04 NOTE — Telephone Encounter (Signed)
Left detailed message for pt to call back. Advised pt on voicemail that Dr. Tresa Endo is out of the office this week.

## 2020-11-06 NOTE — Telephone Encounter (Signed)
Returned call to pt. Advised pt that creatinine would be check as part of his CMP, so no additional lab work needed to be ordered. Additionally, spoke with pt about his issue with his CPAP. He states he was using [facility], but wanted to switch to [facility]. States he has spoken with [facility] to communicate this transition, and he says they have released him. Currently pt is not linked where Dr. Tresa Endo can view his compliance and therapy data. Advised pt that RN would reach out to [facility] directly to get pt linked and would send a mychart message to confirm when this has been successful. Pt verbalizes understanding and appreciation for our help.  Second call placed to [facility]. Message left for Sunday Shams to assist in linking pt.

## 2020-11-06 NOTE — Telephone Encounter (Signed)
Patient called to return Sarah's call. He will await her return call - he is currently at home from a covid exposure so has more time to answer. He states he thinks Dr. Tresa Endo wanted him to also get a creatinine panel with his lab work, I see orders for CMP and Lipid. Please call/advise   Thank you!

## 2020-11-07 NOTE — Telephone Encounter (Signed)
Will route to sleep coordinator and RN for follow up.

## 2020-11-11 DIAGNOSIS — G4733 Obstructive sleep apnea (adult) (pediatric): Secondary | ICD-10-CM | POA: Diagnosis not present

## 2020-11-14 DIAGNOSIS — E785 Hyperlipidemia, unspecified: Secondary | ICD-10-CM | POA: Diagnosis not present

## 2020-11-14 DIAGNOSIS — I10 Essential (primary) hypertension: Secondary | ICD-10-CM | POA: Diagnosis not present

## 2020-11-14 LAB — COMPREHENSIVE METABOLIC PANEL
ALT: 28 IU/L (ref 0–44)
AST: 22 IU/L (ref 0–40)
Albumin/Globulin Ratio: 1.6 (ref 1.2–2.2)
Albumin: 4.1 g/dL (ref 3.7–4.7)
Alkaline Phosphatase: 81 IU/L (ref 44–121)
BUN/Creatinine Ratio: 15 (ref 10–24)
BUN: 17 mg/dL (ref 8–27)
Bilirubin Total: 0.6 mg/dL (ref 0.0–1.2)
CO2: 23 mmol/L (ref 20–29)
Calcium: 9.6 mg/dL (ref 8.6–10.2)
Chloride: 104 mmol/L (ref 96–106)
Creatinine, Ser: 1.13 mg/dL (ref 0.76–1.27)
GFR calc Af Amer: 75 mL/min/{1.73_m2} (ref >59)
GFR calc non Af Amer: 65 mL/min/{1.73_m2} (ref >59)
Globulin, Total: 2.6 g/dL (ref 1.5–4.5)
Glucose: 116 mg/dL — ABNORMAL HIGH (ref 65–99)
Potassium: 4.4 mmol/L (ref 3.5–5.2)
Sodium: 139 mmol/L (ref 134–144)
Total Protein: 6.7 g/dL (ref 6.0–8.5)

## 2020-11-14 LAB — LIPID PANEL
Chol/HDL Ratio: 2.6 ratio (ref 0.0–5.0)
Cholesterol, Total: 136 mg/dL (ref 100–199)
HDL: 53 mg/dL (ref >39)
LDL Chol Calc (NIH): 63 mg/dL (ref 0–99)
Triglycerides: 110 mg/dL (ref 0–149)
VLDL Cholesterol Cal: 20 mg/dL (ref 5–40)

## 2020-11-17 ENCOUNTER — Telehealth (INDEPENDENT_AMBULATORY_CARE_PROVIDER_SITE_OTHER): Payer: PPO | Admitting: Cardiovascular Disease

## 2020-11-17 ENCOUNTER — Encounter: Payer: Self-pay | Admitting: Cardiovascular Disease

## 2020-11-17 VITALS — BP 149/71 | HR 61 | Ht 74.0 in | Wt 315.0 lb

## 2020-11-17 DIAGNOSIS — E785 Hyperlipidemia, unspecified: Secondary | ICD-10-CM

## 2020-11-17 DIAGNOSIS — R931 Abnormal findings on diagnostic imaging of heart and coronary circulation: Secondary | ICD-10-CM

## 2020-11-17 DIAGNOSIS — I10 Essential (primary) hypertension: Secondary | ICD-10-CM | POA: Diagnosis not present

## 2020-11-17 DIAGNOSIS — G4733 Obstructive sleep apnea (adult) (pediatric): Secondary | ICD-10-CM | POA: Diagnosis not present

## 2020-11-17 NOTE — Patient Instructions (Signed)
Medication Instructions:  The current medical regimen is effective;  continue present plan and medications.  *If you need a refill on your cardiac medications before your next appointment, please call your pharmacy*  Follow-Up: At [facility], you and your health needs are our priority.  As part of our continuing mission to provide you with exceptional heart care, we have created designated Provider Care Teams.  These Care Teams include your primary Cardiologist (physician) and Advanced Practice Providers (APPs -  Physician Assistants and Nurse Practitioners) who all work together to provide you with the care you need, when you need it.  We recommend signing up for the patient portal called "MyChart".  Sign up information is provided on this After Visit Summary.  MyChart is used to connect with patients for Virtual Visits (Telemedicine).  Patients are able to view lab/test results, encounter notes, upcoming appointments, etc.  Non-urgent messages can be sent to your provider as well.   To learn more about what you can do with MyChart, go to [email or web address].    Your next appointment:   6 month(s)  The format for your next appointment:   In Person  Provider:   Nicki Guadalajara, MD   Other Instructions Changing min pressure from 5 to 7.

## 2020-11-17 NOTE — Progress Notes (Signed)
Virtual Visit via Telephone Note   This visit type was conducted due to national recommendations for restrictions regarding the COVID-19 Pandemic (e.g. social distancing) in an effort to limit this patient's exposure and mitigate transmission in our community.  Due to his co-morbid illnesses, this patient is at least at moderate risk for complications without adequate follow up.  This format is felt to be most appropriate for this patient at this time.  The patient did not have access to video technology/had technical difficulties with video requiring transitioning to audio format only (telephone).  All issues noted in this document were discussed and addressed.  No physical exam could be performed with this format.  Please refer to the patient's chart for his  consent to telehealth for [facility].   Evaluation Performed:  Follow-up visit  This visit type was conducted due to national recommendations for restrictions regarding the COVID-19 Pandemic (e.g. social distancing).  This format is felt to be most appropriate for this patient at this time.  All issues noted in this document were discussed and addressed.  No physical exam was performed (except for noted visual exam findings with Video Visits).  Please refer to the patient's chart (MyChart message for video visits and phone note for telephone visits) for the patient's consent to telehealth for [facility].  Date:  11/17/2020   ID:  [name], DOB 04-10-1951, MRN [id number]  Patient Location: home [address] [place] [address]   Provider location:   office  PCP:  [name], MD  Cardiologist:  Nicki Guadalajara, MD Electrophysiologist:  None   Chief Complaint:  F/U sleep/cardiology  History of Present Illness:    [name] is a 70 y.o. male who presents via audio/video conferencing for a telehealth visit today.   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  He was last evaluated in June 2021 in a telemedicine encounter and presents now for follow-up cardiology/sleep evaluation following undergoing sleep evaluations.  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 for primary care and most recently sees Dr. [name].He admits to a history of hypertension and most recently has been on telmisartan which he takes 40 mg daily since 2008. He has a history of hyperlipidemia review of prior records that he brought with him today to his office visit, in 1994 his LDL cholesterol was 214. In 2006 he had undergone Lipomed NMRevaluation in my review of this data today demonstrates significant increased LDL small particles at 1907 with markedly increased levels of LDL particles at 2954. Apparently had been on treatment with Crestor but did experience some myalgias. More recently he has been taking 10 mg 3 times per week but he had been off Crestor for 8 to 9 months. Review of echo Doppler data from 2008 revealed normal systolic function with EF at 65% with grade 1 diastolic dysfunction.[id number] he was hospitalizedandwas significantly hypertensive. He underwent cardiac catheterization Dr. Dickie La and was not told of having obstructive disease. His last echo was done in 2016 which also showed an EF of 60 to 65%. There was moderate biatrial enlargement.   Remotely, the patient had seen Dr. Porfirio Mylar Dohmeier and was diagnosed with struct of sleep apnea. He underwent an initial sleep study on May 08, 2015 at which time he weighed 324 pounds and BMI was 41.6. Sleep apnea was moderate with an AHI of 25.4 and was strongly supine  dependent. He had significant oxygen desaturation to a nadir of 64%. He last saw Dr. Vickey Huger in 2019. At that time his 95th percentile CPAP pressure was 9.2 cm. Over the past year and a half, unfortunately his wife was diagnosed with cancer and has been undergoing  treatments. He admits to a 35 pound weight loss since January 2020. He has not used CPAP in over a year. On questioning today, he tells me that he does have snoring. At times he can find himself falling asleepifcircumstances persist. His sleep is not as restorative as it had been previously andhis wife has noticed some apneic events. In the office today I did obtain an Epworth Sleepiness Scale score which endorsed at 12 consistent with excessive daytime sleepiness.  He had seen Dr. Prince Solian laboratory from November 30, 2019 showed a total cholesterol of 230, triglycerides 146, HDL 48, LDL 153. Apolipoprotein B was 117. BUN 18 creatinine 1.0. He was referred for a coronary calcium score was done on December 14, 2019 which revealed a total Agatsonscore of 278 with left main calcium 33, LAD 151, left circumflex 38, and RCA 56. He was also noted to have aortic atherosclerosis and mild calcification of his aortic valve was detected. Roe Coombs denies any change in symptoms status. He specifically denies any chest pain or any exertional dyspnea. He denies any change in exercise tolerance. He walks his dog daily at a fast pace and has not noticed any decline in symptoms. He had called me following his results and now presents for initial cardiology evaluation. Following his calcium score findings, Crestor was recently increased to 20 mg daily.  During my initial evaluation there is elevatedAgaston score of 278 with calcification involving his coronary arteriesIrecommended the addition of Zetia to his rosuvastatin 20 mg. I also recommended that he undergo a2D echo Doppler study to further evaluate his aortic systolic murmur. This was done on 12/21/2019 which revealed normal systolic function with grade 2 diastolic dysfunction. His left atrium was mildly dilated. There was mild mitral regurgitation and mild to moderate aortic sclerosis/calcification without stenosis. Laboratory on April 14, 2020  marked improvement in lipid studies with total cholesterol 120, triglycerides 83, HDL 53, and LDL 51. He has tolerated the combination of Crestor 20 mg and Zetia without side effects.   When he was evaluated in a telemedicine visit on April 16, 2020 he was asymptomatic with reference to chest pain or shortness of breath or palpitations. He was never able to undergo a sleep study due to the restrictions of the Covid testing and his work Counselling psychologist. He would like to pursue this evaluation since his machine is old and he believes he would benefit from reinitiation of therapy.   He underwent diagnostic sleep study on June 05, 2020.  Symptoms included snoring, witnessed apnea, gasping for breath during sleep and daytime sleepiness.  He was found to have severe obstructive sleep apnea with an AHI of 32.2/h.  Events were more severe with supine sleep with an AHI of 36.7/h and during REM sleep with an AHI 56.5/h.  There was no central apnea.  He had severe oxygen desaturation to a nadir of 76% with non-REM sleep and to 69% during REM sleep.  He was also found to have occasional to frequent PVCs which were most prominent during REM sleep and he developed transient episodes of ventricular bigeminy and trigeminy during REM sleep.  He was referred for a CPAP titration study which was done on July 03, 2020.  CPAP was initiated  at 6 cm and was titrated to 16 cm water pressure.  Of note, AHI at 16 was 0 but he did not achieve REM sleep.  At 15 cm water pressure, AHI was elevated at 23.3 NREM sleep developed.  He was recommended that he start CPAP auto therapy with an EPR of 3 at a range of 16 to 20 cm of water pressure.  Apparently, due to the patient's wife's illness he had to cancel his appointment with [facility] with [facility].  Since he had had a prior machine, apparently this also was a ResMed air sense 10.  He has been using his old machine and he believes the settings are auto mode at a range  of 5-15.    I last saw him in a video telemedicine conference on August 08, 2020.  With reinstitution of CPAP therapy he has noticed a dramatic benefit in his sleep quality.  He is no longer falling asleep during the day at times while having conversations.  He is sleeping much better.  He is more alert.  He is now dreaming. He has continued to take propranolol low-dose 10 mg twice a day for his hand tremor and continues to be on telmisartan 40 mg for blood pressure control.  He states his blood pressure has been excellent.  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  He typically goes to bed between 1030 and 1130 and wakes up between 6 and 630.  However, prior to reinitiating CPAP he often was getting up around 2:00 and not sleeping well thereafter.  Due to his wife's progressive illness, Dr. Hazle Quantigby had notified me yesterday and stated he would prefer a phone visit today rather than being seen in the office since he needs to be with his wife.  I download was obtained from October 15, 2020 through November 13, 2020.  CPAP use is 100%.  His wife is not sleeping well and he often has to wake up.  As result his average use is 4 hours and 14 minutes.  His machine is been at a 5 to 15 cm of water pressure range.  AHI is excellent at 1.9.  He has been obtaining supplies from [facility].  He has a ResMed AirFit N 30i  mask.  There is no significant mask leak.  He states he is sleeping well.  He never typically gets more than 5 hours of sleep which has been a consistent pattern.  There has been recent increase stress unfortunately with his wife's progressive illness and she now has some cognitive impairment issues.  He underwent follow-up laboratory on November 14, 2020.  He has been on rosuvastatin 20 mg in addition to Zetia 10 mg a total cholesterol is 136, triglycerides 110, HDL 53, and LDL 63.  His blood pressure typically runs 115/65 at home with pulse around 55.  He  continues to be on telmisartan 80 mg daily in addition to his propranolol 10 mg twice a day.  He denies any chest pain or shortness of breath.  He presents for reevaluation   The patient does not symptoms concerning for COVID-19 infection (fever, chills, cough, or new SHORTNESS OF BREATH).    Prior CV studies:   The following studies were reviewed today:   ECHO 01/18/2020 IMPRESSIONS  1. Left ventricular ejection fraction, by estimation, is 60 to 65%. The  left ventricle has normal function. The left ventricle has no regional  wall motion abnormalities. There is mild concentric left ventricular  hypertrophy. Left ventricular diastolic  parameters are consistent with Grade II diastolic dysfunction  (pseudonormalization).  2. Right ventricular systolic function is normal. The right ventricular  size is normal.  3. Left atrial size was mildly dilated.  4. The mitral valve is normal in structure. Mild mitral valve  regurgitation. No evidence of mitral stenosis.  5. The aortic valve is normal in structure. Aortic valve regurgitation is  not visualized. Mild to moderate aortic valve sclerosis/calcification is  present, without any evidence of aortic stenosis.  6. The inferior vena cava is normal in size with greater than 50%  respiratory variability, suggesting right atrial pressure of 3 mmHg.    06/05/2020 CLINICAL INFORMATION Sleep Study Type: NPSG  Indication for sleep study: Snoring, Witnesses Apnea / Gasping During Sleep  Epworth Sleepiness Score: 17  Prior sleep study: July 2016: AHI 25.4/h; O2 nadir 64%.  SLEEP STUDY TECHNIQUE As per the AASM Manual for the Scoring of Sleep and Associated Events v2.3 (April 2016) with a hypopnea requiring 4% desaturations.  The channels recorded and monitored were frontal, central and occipital EEG, electrooculogram (EOG), submentalis EMG (chin), nasal and oral airflow, thoracic and abdominal wall motion, anterior tibialis EMG,  snore microphone, electrocardiogram, and pulse oximetry.  MEDICATIONS albuterol (VENTOLIN HFA) 108 (90 Base) MCG/ACT inhaler aspirin (BAYER LOW STRENGTH) 81 MG EC tablet azelastine (ASTELIN) 0.1 % nasal spray cyanocobalamin 1000 MCG tablet diclofenac (VOLTAREN) 75 MG EC tablet ezetimibe (ZETIA) 10 MG tablet famotidine (PEPCID) 10 MG tablet fluticasone (CUTIVATE) 0.05 % cream fluticasone (FLONASE) 50 MCG/ACT nasal spray montelukast (SINGULAIR) 10 MG tablet Multiple Vitamins-Minerals (MACULAR VITAMIN BENEFIT PO) propranolol (INDERAL) 10 MG tablet rosuvastatin (CRESTOR) 10 MG tablet tadalafil (CIALIS) 20 MG tablet telmisartan (MICARDIS) 80 MG tablet traMADol (ULTRAM) 50 MG tablet  Medications self-administered by patient taken the night of the study : N/A  SLEEP ARCHITECTURE The study was initiated at 10:14:35 PM and ended at 5:06:07 AM.  Sleep onset time was 0.9 minutes and the sleep efficiency was 95.0%%. The total sleep time was 391 minutes.  Stage REM latency was 80.0 minutes.  The patient spent 3.6%% of the night in stage N1 sleep, 79.0%% in stage N2 sleep, 0.0%% in stage N3 and 17.4% in REM.  Alpha intrusion was absent.  Supine sleep was 78.95%.  RESPIRATORY PARAMETERS The overall apnea/hypopnea index (AHI) was 32.2 per hour. The respiratory disturbance index (RDI) was 32.5/h. There were 151 total apneas, including 151 obstructive, 0 central and 0 mixed apneas. There were 59 hypopneas and 2 RERAs.  The AHI during Stage REM sleep was 56.5 per hour.  AHI while supine was 36.7 per hour.  The mean oxygen saturation was 91.5%. The minimum SpO2 during sleep was 69.0%.  Loud snoring was noted during this study.  CARDIAC DATA The 2 lead EKG demonstrated sinus rhythm. The mean heart rate was 54.6 beats per minute. Other EKG findings include: PVCs.  LEG MOVEMENT DATA The total PLMS were 0 with a resulting PLMS index of 0.0. Associated arousal with leg  movement index was 0.0 .  IMPRESSIONS - Severe obstructive sleep apnea occurred during this study (AHI 32.2/h; RDI 32.5/h); events were worse with supine sleep (AHI 36.7/h) and during REM sleep (AHI 56.5/h). - No significant central sleep apnea occurred during this study (CAI = 0.0/h). - Severe oxygen desaturation to a nadir of 76% with NREM and 69.0% during REM sleep. - The patient snored with loud snoring volume. -  EKG findings include occasional to frequent PVCs, most prominent during REM sleep with episodes of transient ventricular bigeminy and trigeminy during REM sleep. - Clinically significant periodic limb movements did not occur during sleep. No significant associated arousals.  DIAGNOSIS - Obstructive Sleep Apnea (G47.33) - Nocturnal Hypoxemia (G47.36)  RECOMMENDATIONS - Therapeutic CPAP titration to determine optimal pressure required to alleviate sleep disordered breathing. - Effort should be made to optimize nasal and oropharyngeal patency. - Positional therapy avoiding supine position during sleep. - Avoid alcohol, sedatives and other CNS depressants that may worsen sleep apnea and disrupt normal sleep architecture. - Sleep hygiene should be reviewed to assess factors that may improve sleep quality. - Weight management (BMI 42) and regular exercise should be initiated or continued if appropriate.   CPAP TITRATION: 07/03/2020 CLINICAL INFORMATION The patient is referred for a CPAP titration to treat sleep apnea.  Date of NPSG: 06/05/2020: AHI 32.2/h; RDI 32.5/h; REM AHI 56.5/h with frequent PVC's during REM sleep; O2 nadir 69%; .  SLEEP STUDY TECHNIQUE As per the AASM Manual for the Scoring of Sleep and Associated Events v2.3 (April 2016) with a hypopnea requiring 4% desaturations.  The channels recorded and monitored were frontal, central and occipital EEG, electrooculogram (EOG), submentalis EMG (chin), nasal and oral airflow, thoracic and abdominal wall motion,  anterior tibialis EMG, snore microphone, electrocardiogram, and pulse oximetry. Continuous positive airway pressure (CPAP) was initiated at the beginning of the study and titrated to treat sleep-disordered breathing.  MEDICATIONS albuterol (VENTOLIN HFA) 108 (90 Base) MCG/ACT inhaler aspirin (BAYER LOW STRENGTH) 81 MG EC tablet azelastine (ASTELIN) 0.1 % nasal spray cyanocobalamin 1000 MCG tablet diclofenac (VOLTAREN) 75 MG EC tablet ezetimibe (ZETIA) 10 MG tablet famotidine (PEPCID) 10 MG tablet fluticasone (CUTIVATE) 0.05 % cream fluticasone (FLONASE) 50 MCG/ACT nasal spray montelukast (SINGULAIR) 10 MG tablet Multiple Vitamins-Minerals (MACULAR VITAMIN BENEFIT PO) propranolol (INDERAL) 10 MG tablet rosuvastatin (CRESTOR) 10 MG tablet tadalafil (CIALIS) 20 MG tablet telmisartan (MICARDIS) 80 MG tablet traMADol (ULTRAM) 50 MG tablet  Medications self-administered by patient taken the night of the study : N/A  TECHNICIAN COMMENTS Comments added by technician: Patient had difficulty initiating sleep. Patient was restless all through the night. ONE REST ROOM VISTED Comments added by scorer: N/A  RESPIRATORY PARAMETERS Optimal PAP Pressure (cm):16AHI at Optimal Pressure (/hr):0.0 Overall Minimal O2 (%):73.0Supine % at Optimal Pressure (%):0 Minimal O2 at Optimal Pressure (%):94.0  SLEEP ARCHITECTURE The study was initiated at 11:03:08 PM and ended at 5:36:42 AM.  Sleep onset time was 9.2 minutes and the sleep efficiency was 81.6%%. The total sleep time was 321 minutes.  The patient spent 4.8%% of the night in stage N1 sleep, 77.7%% in stage N2 sleep, 0.2%% in stage N3 and 17.3% in REM.Stage REM latency was 64.0 minutes  Wake after sleep onset was 63.4. Alpha intrusion was absent. Supine sleep was 48.44%.  CARDIAC DATA The 2 lead EKG demonstrated sinus rhythm. The mean heart rate was 53.8 beats per minute. Other EKG findings  include: PVCs.  LEG MOVEMENT DATA The total Periodic Limb Movements of Sleep (PLMS) were 0. The PLMS index was 0.0. A PLMS index of <15 is considered normal in adults.  IMPRESSIONS - CPAP was initiated at 6 cm and was titrated to optimal PAP pressure at 16 cm of water; AHI 0; O2 nadir 94%, but REM sleep did not occur at 16 cm. AHI at 15 cm with REM sleep was elevated at 23.3/h. - Central sleep apnea was not noted during this  titration (CAI = 0.0/h). - Severe oxygen desaturations to a nadir of 73.0% at 12 cm of water with REM sleep. - No snoring was audible during this study. - 2-lead EKG demonstrated: PVCs - Clinically significant periodic limb movements were not noted during this study. Arousals associated with PLMs were rare.  DIAGNOSIS - Obstructive Sleep Apnea (G47.33) - Nocturnal hypoxemia  RECOMMENDATIONS - Recommend an initial trial of CPAP Auto therapy with EPR of 3 at 16 -20 cm H2O with heated humidification. A Large size Philips Respironics Nasal Pillow Mask Nuance Pro Gel mask was used for the titration. - Effort should be made to optimize nasal and oropharyngeal patency. - Avoid alcohol, sedatives and other CNS depressants that may worsen sleep apnea and disrupt normal sleep architecture. - Sleep hygiene should be reviewed to assess factors that may improve sleep quality. - Weight management (BMI 42) and regular exercise should be initiated or continued. - Recommend a download in 30 days and sleep Center evaluation after 4 weeks of therapy. Past Medical History:  Diagnosis Date  . Non-cardiac chest pain 2008   and arm pain; Dr. Charlies ConstableBruce Brodie.  . Other and unspecified hyperlipidemia    NMR 2006: LDL 206(2954/1907), HDL 43, TG 82. LDL goal = <100. Framingham study LDL goal =  < 130   Past Surgical History:  Procedure Laterality Date  . CARDIAC CATHETERIZATION  2008   negative; Dr Juanda ChanceBrodie  . COLONOSCOPY  2009   negative; Dr Arlyce DiceKaplan  . I&D L knee  2009   for post  traumatic cellulitis/abscess  . TONSILLECTOMY       Current Meds  Medication Sig  . albuterol (VENTOLIN HFA) 108 (90 Base) MCG/ACT inhaler Inhale into the lungs.  Marland Kitchen. aspirin 81 MG EC tablet Take 81 mg by mouth daily.  Marland Kitchen. azelastine (ASTELIN) 0.1 % nasal spray Place into the nose.  . cyanocobalamin 1000 MCG tablet Take by mouth.  . diclofenac (VOLTAREN) 75 MG EC tablet   . ezetimibe (ZETIA) 10 MG tablet Take 10 mg by mouth daily.  . famotidine (PEPCID) 10 MG tablet Take by mouth.  . fluticasone (CUTIVATE) 0.05 % cream   . fluticasone (FLONASE) 50 MCG/ACT nasal spray USE 1 SPRAY IN EACH NOSTRIL TWICE A DAY.  . montelukast (SINGULAIR) 10 MG tablet Take by mouth.  . Multiple Vitamins-Minerals (MACULAR VITAMIN BENEFIT PO) Take by mouth daily.  . propranolol (INDERAL) 10 MG tablet Take 1 tablet (10 mg total) by mouth 2 (two) times daily.  . rosuvastatin (CRESTOR) 10 MG tablet Take 20 mg by mouth daily.   . tadalafil (CIALIS) 20 MG tablet Take 1 tablet (20 mg total) by mouth every three (3) days as needed for erectile dysfunction. 1 every 3 days prn  . telmisartan (MICARDIS) 80 MG tablet Take 1 tablet (80 mg total) by mouth daily. (Patient taking differently: Take 40 mg by mouth daily.)  . traMADol (ULTRAM) 50 MG tablet      Allergies:   Metoprolol succinate and Breo ellipta [fluticasone furoate-vilanterol]   Social History   Tobacco Use  . Smoking status: Never Smoker  . Smokeless tobacco: Never Used  Substance Use Topics  . Alcohol use: Yes    Alcohol/week: 9.0 standard drinks    Types: 9 Glasses of wine per week    Comment: socially     Family Hx: The patient's family history includes Coronary artery disease in an other family member; Dementia in his father; Glaucoma in his father; Heart attack in his cousin,  maternal grandfather, and maternal uncle; Pancreatic cancer in his father; Prostate cancer in his father; Stroke (age of onset: 46) in his maternal grandfather. There is no  history of Asthma or Diabetes.  ROS:   Please see the history of present illness.    No fevers chills or night sweats, No cough No palpitations Blood pressure at home stable at 115/65 and pulse in the mid 50s Arthritic issues involving neck, back, knees and ankles No abdominal pain No swelling Sleeping significantly improved with reinstitution of CPAP  All other systems reviewed and are negative.   Labs/Other Tests and Data Reviewed:     I personally reviewed his ECG from December 28, 2019 which showed normal sinus rhythm at 60 bpm, first-degree AV block, small Q-wave in lead III without significant ST changes.  PR interval 250 ms, QTc interval 456 ms.   Recent Labs: 11/14/2020: ALT 28; BUN 17; Creatinine, Ser 1.13; Potassium 4.4; Sodium 139   Recent Lipid Panel Lab Results  Component Value Date/Time   CHOL 136 11/14/2020 08:20 AM   CHOL 231 (H) 01/28/2015 08:01 AM   TRIG 110 11/14/2020 08:20 AM   TRIG 154 (H) 01/28/2015 08:01 AM   TRIG 97 08/30/2006 07:36 AM   HDL 53 11/14/2020 08:20 AM   HDL 51 01/28/2015 08:01 AM   CHOLHDL 2.6 11/14/2020 08:20 AM   CHOLHDL 5 01/28/2015 08:01 AM   LDLCALC 63 11/14/2020 08:20 AM   LDLCALC 149 (H) 01/28/2015 08:01 AM   LDLDIRECT 143.4 11/21/2012 08:01 AM    Wt Readings from Last 3 Encounters:  11/17/20 (!) 315 lb (142.9 kg)  08/08/20 (!) 315 lb (142.9 kg)  07/03/20 (!) 311 lb (141.1 kg)     Exam:    Vital Signs:  BP (!) 149/71   Pulse 61   Ht 6\' 2"  (1.88 m)   Wt (!) 315 lb (142.9 kg)   BMI 40.44 kg/m    Since this was not audio visit I could not physically examine the patient Breathing was normal and not labored There was no audible wheezing His heart rate has been stable without irregularity Blood pressure this morning was elevated but he has been up since the very early morning There is obviously increased stress with his wife's progressive illness He denied edema  ASSESSMENT & PLAN:    1.  OSA: I reviewed the most  recent download that we obtained.  Apparently his old CPAP machine is set on his prior pressures in the high pressures that were initially recommended in the CPAP titration were not implemented.  On his download, AHI is excellent at 1.9 and his 95th percentile pressure is 9.8 with a maximum average pressure of 10.7.  For this reason I will not significantly alter his pressures but I had suggested perhaps instead of starting at 5 cm to initiate therapy at 7 cm but continue up to the previous pressure at 15.  He tried this for 1 night and the following day contacted me and felt that it was too high to begin with and would prefer going back to the 5 to 15 cm range which we have again done.  He believes he is sleeping well with therapy.  I discussed the importance of optimal sleep duration in an adult being at least 7 to 8 hours.  He states consistently he rarely sleeps beyond 5 hours.  He has been tending to his wife's needs. 2. Essential hypertension: Blood pressure at home tends to run by his report  at 115/65.  Blood pressure today was slightly increased.  He continues to be on telmisartan 80 mg and low-dose propranolol 10 mg twice a day.  Resting pulse is in the 50s routinely.  He will continue to monitor his blood pressure and if trends remain elevated additional medical [place] will be recommended. 3.  Hyperlipidemia: He is now on increased rosuvastatin 20 mg as well as Zetia.  LDL cholesterol remains stable although LDL has increased from 51 up to 63 since his prior evaluation in June 2021.  He will continue with current therapy. 4.  Calcium score 278: We will continue on rosuvastatin/Zetia and attempt to induce plaque regression.  No anginal symptomatology 5.  History of aortic murmur: Mild to moderate aortic sclerosis without stenosis on echo.  Normal LV function with grade 2 diastolic dysfunction 6.  Obesity: BMI 40.4.  Weight loss and exercise recommended.  Exercise has been limited by arthritic  issues  COVID-19 Education: The signs and symptoms of COVID-19 were discussed with the patient and how to seek care for testing (follow up with PCP or arrange E-visit).  The importance of social distancing was discussed today.  Patient Risk:   After full review of this patients clinical status, I feel that they are at least moderate risk at this time.  Time:   Today, I have spent 24 minutes with the patient with telehealth technology discussing the above problems.     Medication Adjustments/Labs and Tests Ordered: Current medicines are reviewed at length with the patient today.  Concerns regarding medicines are outlined above.  Tests Ordered: No orders of the defined types were placed in this encounter.  Medication Changes: No orders of the defined types were placed in this encounter.   Disposition: F/U 6 months  Signed, Nicki Guadalajara, MD  11/17/2020 9:50 AM    [facility]

## 2020-11-23 ENCOUNTER — Encounter: Payer: Self-pay | Admitting: Cardiovascular Disease

## 2021-03-02 DIAGNOSIS — E785 Hyperlipidemia, unspecified: Secondary | ICD-10-CM | POA: Diagnosis not present

## 2021-03-02 DIAGNOSIS — Z125 Encounter for screening for malignant neoplasm of prostate: Secondary | ICD-10-CM | POA: Diagnosis not present

## 2021-03-02 LAB — LIPID PANEL
Cholesterol: 154 (ref 0–200)
HDL: 57 (ref 35–70)
LDL Cholesterol: 77
LDl/HDL Ratio: 1.4
Triglycerides: 99 (ref 40–160)

## 2021-03-21 IMAGING — CT CT CARDIAC CORONARY ARTERY CALCIUM SCORE
3 series · 14 of 20 positions shown, 16 images · non-contrast
Comparison: No priors.

CLINICAL DATA: 68-year-old Caucasian male with elevated
cholesterol.

EXAM:
CT CARDIAC CORONARY ARTERY CALCIUM SCORE
TECHNIQUE: Non-contrast imaging through the heart was performed using
prospective ECG gating. Image post processing was performed on an
independent workstation, allowing for quantitative analysis of the
heart and coronary arteries. Note that this exam targets the heart
and the chest was not imaged in its entirety.

[Series 2: calcium scoring 2.00 qr36 bestdiast 69% · axial · 0.44mm/px · z∈[+1782,+1878]mm · 4 of 80 slices shown]
[im 16/80  vessel]
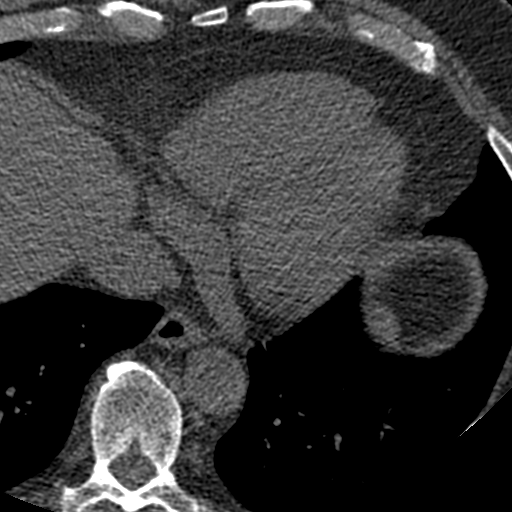
[im 32/80  vessel]
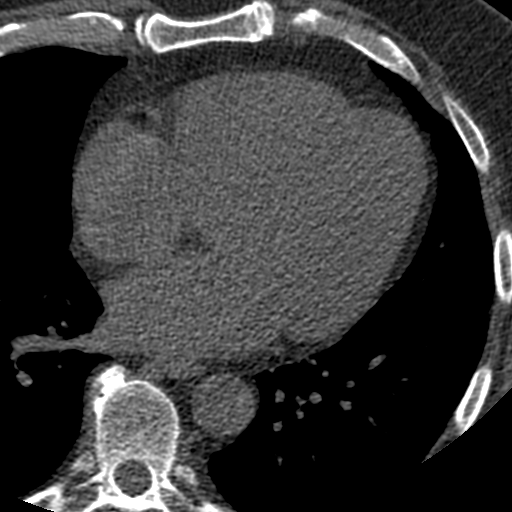
[im 48/80  vessel]
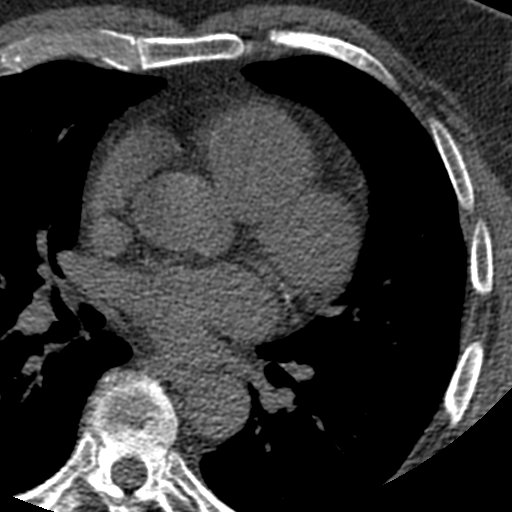
[im 64/80  vessel]
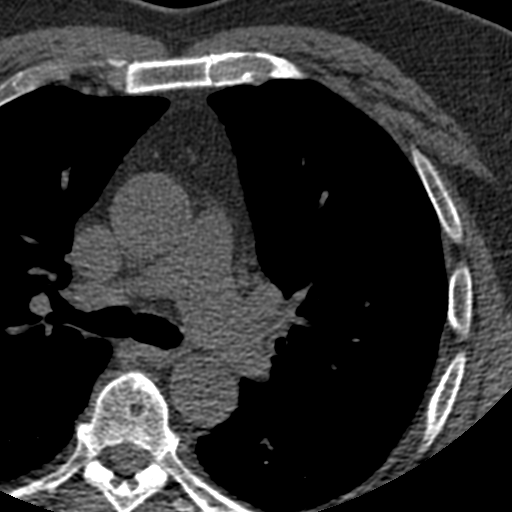

[Series 3: calcium scoring 2.00 br40 bestdiast 69% fov · axial · 0.68mm/px · z∈[+1778,+1882]mm · 5 of 80 slices shown, 7 images]
[im 14/80  vessel]
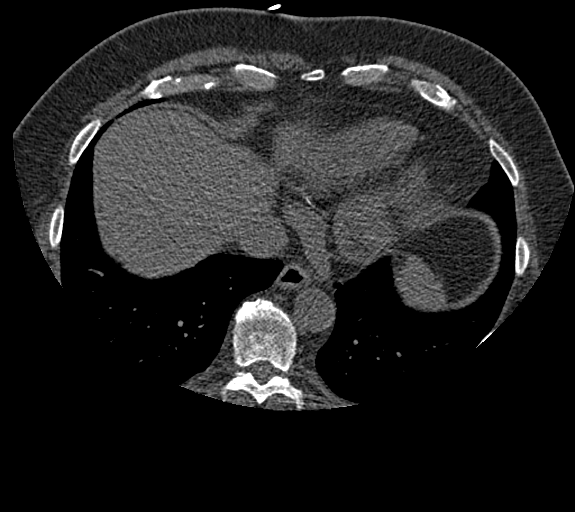
[im 14/80  lung]
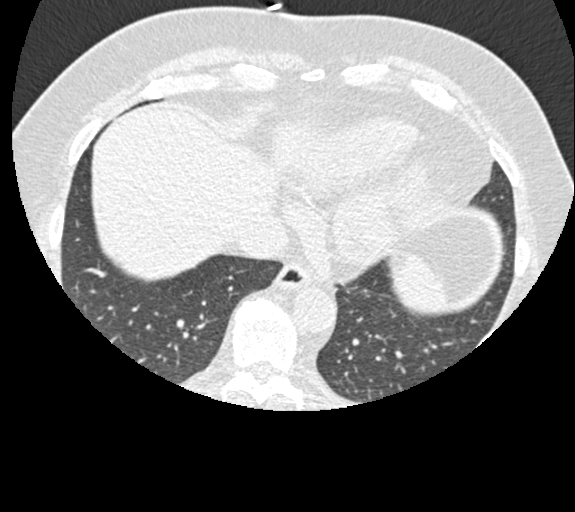
[im 27/80  vessel]
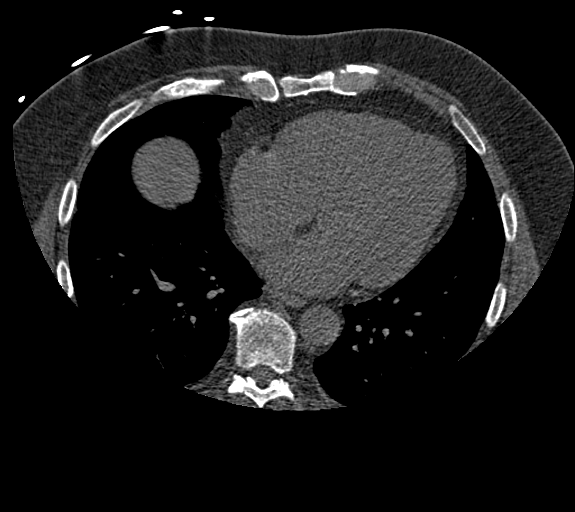
[im 40/80  vessel]
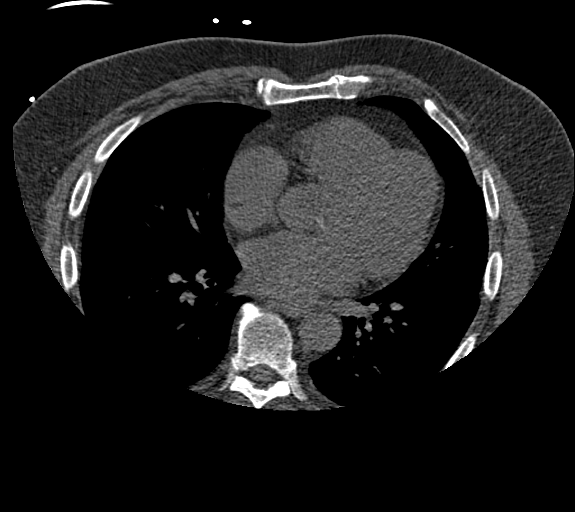
[im 53/80  vessel]
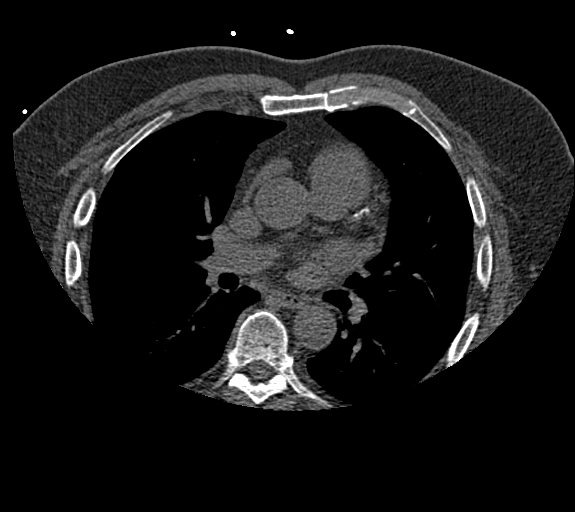
[im 66/80  vessel]
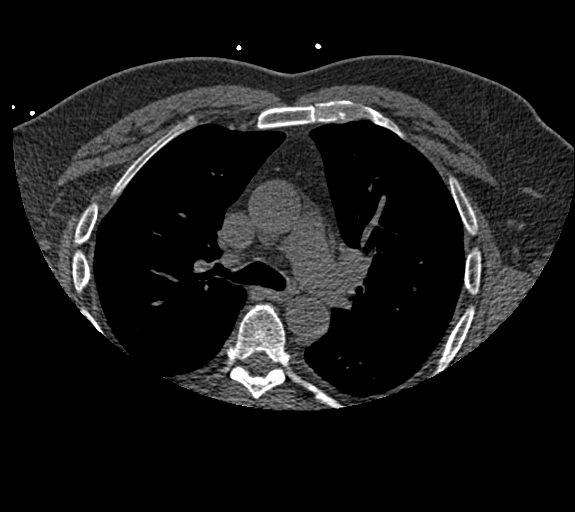
[im 66/80  lung]
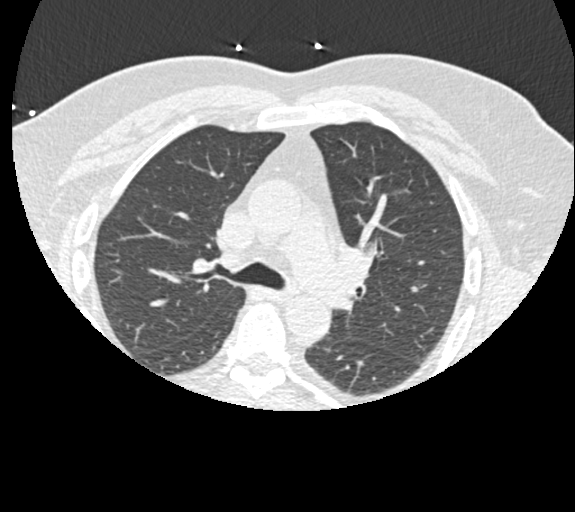

[Series 9: calcium scoring 2.00 br60 bestdiast 69% fov · axial · 0.68mm/px · z∈[+1778,+1882]mm · 5 of 80 slices shown]
[im 14/80  vessel]
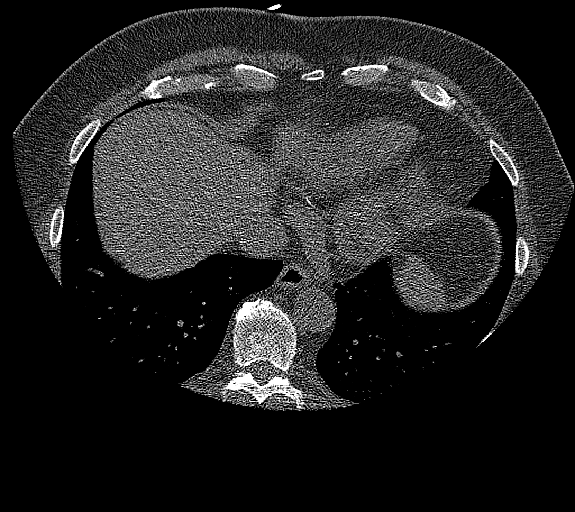
[im 27/80  vessel]
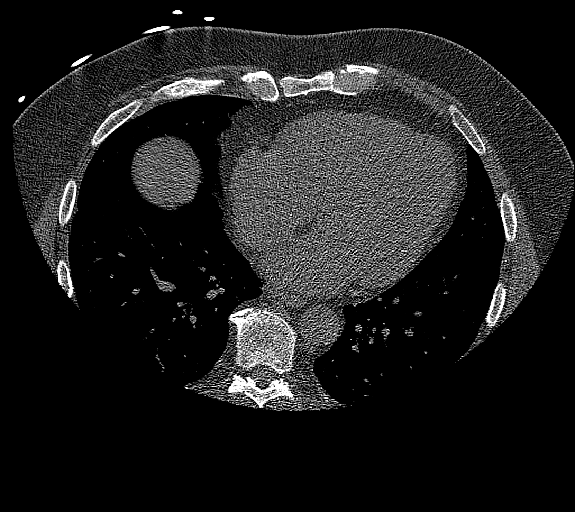
[im 40/80  vessel]
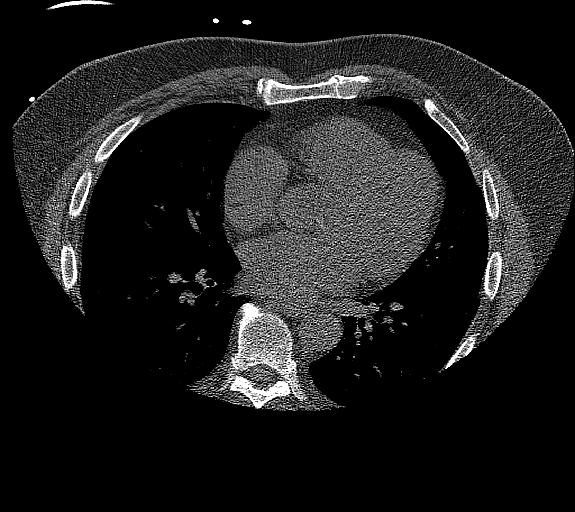
[im 53/80  vessel]
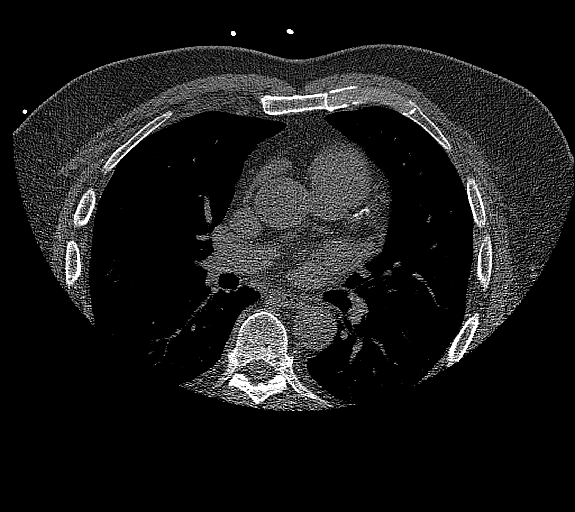
[im 66/80  vessel]
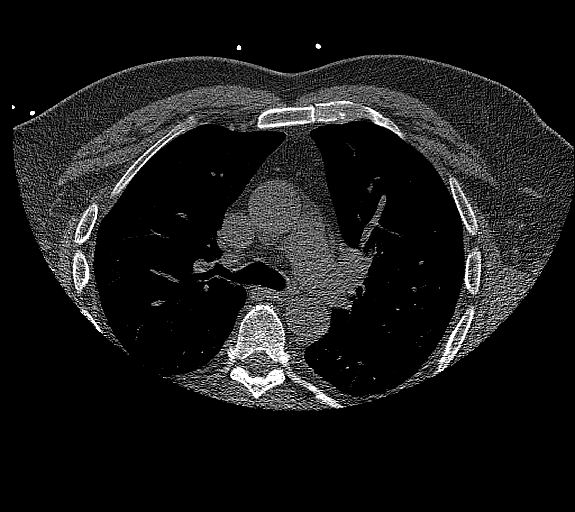

[14 of 20 positions shown; findings below may reference images not displayed]

FINDINGS: CORONARY CALCIUM SCORES:

Left Main: 33

LAD: 151

LCx: 38

RCA: 56

Total Agatston Score: 278

[HOSPITAL] percentile: 62nd

AORTA MEASUREMENTS:

Ascending Aorta: 36 mm

Descending Aorta: 29 mm

OTHER FINDINGS:

Aortic atherosclerosis. Mild calcifications of the aortic valve.
Within the visualized portions of the thorax there are no suspicious
appearing pulmonary nodules or masses, there is no acute
consolidative airspace disease, no pleural effusions, no
pneumothorax and no lymphadenopathy. Visualized portions of the
upper abdomen are unremarkable. There are no aggressive appearing
lytic or blastic lesions noted in the visualized portions of the
skeleton.
IMPRESSION: 1. Patient's total coronary artery calcium score is 278 which is
62nd percentile for patient's of matched age, gender and
race/ethnicity. Please note that although the presence of coronary
artery calcium documents the presence of coronary artery disease,
the severity of this disease and any potential stenosis cannot be
assessed on this noncontrast CT examination. Assessment for
potential risk factor modification, dietary therapy or pharmacologic
therapy may be warranted, if clinically indicated.
2.  Aortic Atherosclerosis ([id number]).
3. There are mild calcifications of the aortic valve.
Echocardiographic correlation for evaluation of potential valvular
dysfunction may be warranted if clinically indicated.

## 2021-03-26 DIAGNOSIS — I700 Atherosclerosis of aorta: Secondary | ICD-10-CM | POA: Diagnosis not present

## 2021-03-26 DIAGNOSIS — I10 Essential (primary) hypertension: Secondary | ICD-10-CM | POA: Diagnosis not present

## 2021-03-26 DIAGNOSIS — Z1389 Encounter for screening for other disorder: Secondary | ICD-10-CM | POA: Diagnosis not present

## 2021-03-26 DIAGNOSIS — Z0000 Encounter for general adult medical examination without abnormal findings: Secondary | ICD-10-CM | POA: Diagnosis not present

## 2021-03-26 DIAGNOSIS — E669 Obesity, unspecified: Secondary | ICD-10-CM | POA: Diagnosis not present

## 2021-03-26 DIAGNOSIS — R7301 Impaired fasting glucose: Secondary | ICD-10-CM | POA: Diagnosis not present

## 2021-03-26 DIAGNOSIS — R82998 Other abnormal findings in urine: Secondary | ICD-10-CM | POA: Diagnosis not present

## 2021-03-26 DIAGNOSIS — G4733 Obstructive sleep apnea (adult) (pediatric): Secondary | ICD-10-CM | POA: Diagnosis not present

## 2021-03-26 DIAGNOSIS — I2510 Atherosclerotic heart disease of native coronary artery without angina pectoris: Secondary | ICD-10-CM | POA: Diagnosis not present

## 2021-03-26 DIAGNOSIS — E785 Hyperlipidemia, unspecified: Secondary | ICD-10-CM | POA: Diagnosis not present

## 2021-03-30 DIAGNOSIS — Z1212 Encounter for screening for malignant neoplasm of rectum: Secondary | ICD-10-CM | POA: Diagnosis not present

## 2021-04-29 DIAGNOSIS — Z974 Presence of external hearing-aid: Secondary | ICD-10-CM | POA: Diagnosis not present

## 2021-04-29 DIAGNOSIS — H6121 Impacted cerumen, right ear: Secondary | ICD-10-CM | POA: Diagnosis not present

## 2021-04-29 DIAGNOSIS — H938X2 Other specified disorders of left ear: Secondary | ICD-10-CM | POA: Diagnosis not present

## 2021-05-31 NOTE — Telephone Encounter (Signed)
Tansferred OSA /Sleep care to Dr Tresa Endo.

## 2021-07-17 DIAGNOSIS — M25511 Pain in right shoulder: Secondary | ICD-10-CM | POA: Diagnosis not present

## 2021-07-17 DIAGNOSIS — M7541 Impingement syndrome of right shoulder: Secondary | ICD-10-CM | POA: Diagnosis not present

## 2021-07-20 DIAGNOSIS — M25511 Pain in right shoulder: Secondary | ICD-10-CM | POA: Diagnosis not present

## 2021-07-20 DIAGNOSIS — M7541 Impingement syndrome of right shoulder: Secondary | ICD-10-CM | POA: Diagnosis not present

## 2021-07-21 DIAGNOSIS — R7301 Impaired fasting glucose: Secondary | ICD-10-CM | POA: Diagnosis not present

## 2021-07-21 DIAGNOSIS — I10 Essential (primary) hypertension: Secondary | ICD-10-CM | POA: Diagnosis not present

## 2021-07-21 DIAGNOSIS — N1831 Chronic kidney disease, stage 3a: Secondary | ICD-10-CM | POA: Diagnosis not present

## 2021-07-21 DIAGNOSIS — I2510 Atherosclerotic heart disease of native coronary artery without angina pectoris: Secondary | ICD-10-CM | POA: Diagnosis not present

## 2021-07-21 DIAGNOSIS — Z23 Encounter for immunization: Secondary | ICD-10-CM | POA: Diagnosis not present

## 2021-08-24 DIAGNOSIS — G4733 Obstructive sleep apnea (adult) (pediatric): Secondary | ICD-10-CM | POA: Diagnosis not present

## 2021-09-30 DIAGNOSIS — I2510 Atherosclerotic heart disease of native coronary artery without angina pectoris: Secondary | ICD-10-CM | POA: Diagnosis not present

## 2021-09-30 DIAGNOSIS — G4733 Obstructive sleep apnea (adult) (pediatric): Secondary | ICD-10-CM | POA: Diagnosis not present

## 2021-09-30 DIAGNOSIS — N1831 Chronic kidney disease, stage 3a: Secondary | ICD-10-CM | POA: Diagnosis not present

## 2021-09-30 DIAGNOSIS — M5030 Other cervical disc degeneration, unspecified cervical region: Secondary | ICD-10-CM | POA: Diagnosis not present

## 2021-09-30 DIAGNOSIS — Z0289 Encounter for other administrative examinations: Secondary | ICD-10-CM

## 2021-09-30 DIAGNOSIS — M791 Myalgia, unspecified site: Secondary | ICD-10-CM | POA: Diagnosis not present

## 2021-09-30 DIAGNOSIS — M6281 Muscle weakness (generalized): Secondary | ICD-10-CM | POA: Diagnosis not present

## 2021-09-30 DIAGNOSIS — E785 Hyperlipidemia, unspecified: Secondary | ICD-10-CM | POA: Diagnosis not present

## 2021-09-30 DIAGNOSIS — I129 Hypertensive chronic kidney disease with stage 1 through stage 4 chronic kidney disease, or unspecified chronic kidney disease: Secondary | ICD-10-CM | POA: Diagnosis not present

## 2021-09-30 DIAGNOSIS — R7301 Impaired fasting glucose: Secondary | ICD-10-CM | POA: Diagnosis not present

## 2021-09-30 DIAGNOSIS — M25511 Pain in right shoulder: Secondary | ICD-10-CM | POA: Diagnosis not present

## 2021-09-30 DIAGNOSIS — J45909 Unspecified asthma, uncomplicated: Secondary | ICD-10-CM | POA: Diagnosis not present

## 2021-09-30 DIAGNOSIS — M25512 Pain in left shoulder: Secondary | ICD-10-CM | POA: Diagnosis not present

## 2021-09-30 DIAGNOSIS — F39 Unspecified mood [affective] disorder: Secondary | ICD-10-CM | POA: Diagnosis not present

## 2021-09-30 LAB — CBC AND DIFFERENTIAL: WBC: 7.7

## 2021-09-30 LAB — BASIC METABOLIC PANEL
BUN: 17 (ref 4–21)
CO2: 20 (ref 13–22)
Chloride: 104 (ref 99–108)
Creatinine: 1.3 (ref 0.6–1.3)
Glucose: 98
Potassium: 4 (ref 3.4–5.3)
Sodium: 138 (ref 137–147)

## 2021-09-30 LAB — HEPATIC FUNCTION PANEL
AST: 27 (ref 14–40)
Alkaline Phosphatase: 36 (ref 25–125)

## 2021-09-30 LAB — HEMOGLOBIN A1C: Hemoglobin A1C: 5

## 2021-09-30 LAB — COMPREHENSIVE METABOLIC PANEL
Albumin: 4.3 (ref 3.5–5.0)
Calcium: 9.7 (ref 8.7–10.7)

## 2021-09-30 LAB — TSH: TSH: 1.59 (ref 0.41–5.90)

## 2021-10-14 DIAGNOSIS — R519 Headache, unspecified: Secondary | ICD-10-CM | POA: Diagnosis not present

## 2021-10-14 DIAGNOSIS — R37 Sexual dysfunction, unspecified: Secondary | ICD-10-CM | POA: Diagnosis not present

## 2021-10-14 DIAGNOSIS — B349 Viral infection, unspecified: Secondary | ICD-10-CM | POA: Diagnosis not present

## 2021-10-14 DIAGNOSIS — Z1152 Encounter for screening for COVID-19: Secondary | ICD-10-CM | POA: Diagnosis not present

## 2021-10-14 DIAGNOSIS — R0981 Nasal congestion: Secondary | ICD-10-CM | POA: Diagnosis not present

## 2021-10-14 DIAGNOSIS — R5383 Other fatigue: Secondary | ICD-10-CM | POA: Diagnosis not present

## 2021-11-03 DIAGNOSIS — I2510 Atherosclerotic heart disease of native coronary artery without angina pectoris: Secondary | ICD-10-CM | POA: Diagnosis not present

## 2021-11-03 DIAGNOSIS — E669 Obesity, unspecified: Secondary | ICD-10-CM | POA: Diagnosis not present

## 2021-11-03 DIAGNOSIS — N1831 Chronic kidney disease, stage 3a: Secondary | ICD-10-CM | POA: Diagnosis not present

## 2021-11-03 DIAGNOSIS — R7301 Impaired fasting glucose: Secondary | ICD-10-CM | POA: Diagnosis not present

## 2021-11-03 DIAGNOSIS — I10 Essential (primary) hypertension: Secondary | ICD-10-CM | POA: Diagnosis not present

## 2021-11-11 ENCOUNTER — Ambulatory Visit (INDEPENDENT_AMBULATORY_CARE_PROVIDER_SITE_OTHER): Payer: PPO | Admitting: Family Medicine

## 2021-11-11 ENCOUNTER — Encounter (INDEPENDENT_AMBULATORY_CARE_PROVIDER_SITE_OTHER): Payer: Self-pay | Admitting: Family Medicine

## 2021-11-11 ENCOUNTER — Other Ambulatory Visit: Payer: Self-pay

## 2021-11-11 VITALS — BP 169/74 | HR 76 | Temp 96.0°F | Ht 72.0 in | Wt 298.0 lb

## 2021-11-11 DIAGNOSIS — M2550 Pain in unspecified joint: Secondary | ICD-10-CM

## 2021-11-11 DIAGNOSIS — E668 Other obesity: Secondary | ICD-10-CM

## 2021-11-11 DIAGNOSIS — E65 Localized adiposity: Secondary | ICD-10-CM | POA: Diagnosis not present

## 2021-11-11 DIAGNOSIS — I25119 Atherosclerotic heart disease of native coronary artery with unspecified angina pectoris: Secondary | ICD-10-CM

## 2021-11-11 DIAGNOSIS — R5383 Other fatigue: Secondary | ICD-10-CM | POA: Diagnosis not present

## 2021-11-11 DIAGNOSIS — R7301 Impaired fasting glucose: Secondary | ICD-10-CM

## 2021-11-11 DIAGNOSIS — R0602 Shortness of breath: Secondary | ICD-10-CM | POA: Diagnosis not present

## 2021-11-11 DIAGNOSIS — J984 Other disorders of lung: Secondary | ICD-10-CM

## 2021-11-11 DIAGNOSIS — E7849 Other hyperlipidemia: Secondary | ICD-10-CM | POA: Diagnosis not present

## 2021-11-11 DIAGNOSIS — G4733 Obstructive sleep apnea (adult) (pediatric): Secondary | ICD-10-CM | POA: Diagnosis not present

## 2021-11-11 DIAGNOSIS — I872 Venous insufficiency (chronic) (peripheral): Secondary | ICD-10-CM | POA: Diagnosis not present

## 2021-11-11 DIAGNOSIS — I10 Essential (primary) hypertension: Secondary | ICD-10-CM

## 2021-11-11 DIAGNOSIS — F3289 Other specified depressive episodes: Secondary | ICD-10-CM

## 2021-11-11 DIAGNOSIS — Z6841 Body Mass Index (BMI) 40.0 and over, adult: Secondary | ICD-10-CM | POA: Diagnosis not present

## 2021-11-11 DIAGNOSIS — F319 Bipolar disorder, unspecified: Secondary | ICD-10-CM | POA: Diagnosis not present

## 2021-11-12 ENCOUNTER — Encounter (INDEPENDENT_AMBULATORY_CARE_PROVIDER_SITE_OTHER): Payer: Self-pay | Admitting: Family Medicine

## 2021-11-12 MED ORDER — OZEMPIC (0.25 OR 0.5 MG/DOSE) 2 MG/1.5ML ~~LOC~~ SOPN: 0.5000 mg | PEN_INJECTOR | SUBCUTANEOUS | 0 refills | Status: DC

## 2021-11-17 ENCOUNTER — Telehealth (INDEPENDENT_AMBULATORY_CARE_PROVIDER_SITE_OTHER): Payer: Self-pay

## 2021-11-17 DIAGNOSIS — R7301 Impaired fasting glucose: Secondary | ICD-10-CM

## 2021-11-17 MED ORDER — TIRZEPATIDE 2.5 MG/0.5ML ~~LOC~~ SOAJ: 2.5000 mg | SUBCUTANEOUS | 0 refills | Status: DC

## 2021-11-17 NOTE — Progress Notes (Signed)
Dear Dr. Joylene Draft,   Thank you for referring [name] to our clinic. The following note includes my evaluation and treatment recommendations.  Chief Complaint:   OBESITY [name] (MR# [id number]) is a 71 y.o. male who presents for evaluation and treatment of obesity and related comorbidities. Current BMI is Body mass index is 40.42 kg/m. [name] has been struggling with his weight for many years and has been unsuccessful in either losing weight, maintaining weight loss, or reaching his healthy weight goal.  [name] is currently in the action stage of change and ready to dedicate time achieving and maintaining a healthier weight. [name] is interested in becoming our patient and working on intensive lifestyle modifications including (but not limited to) diet and exercise for weight loss.  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  He was widowed in 12/2020.  He is now dating again.  He played football at [employer] when he was young.  Followed by [name] for multiple joint issues.  [name]'s habits were reviewed today and are as follows: he thinks his family will eat healthier with him, he has been heavy most of his life, he started gaining weight in 1988, his heaviest weight ever was 325 pounds, he craves salty, sweet & sour, BBQ, cookies, chocolate, he is frequently drinking liquids with calories, and he struggles with emotional eating.  Depression Screen [name]'s Food and Mood (modified PHQ-9) score was 15.  Depression screen PHQ 2/9 11/11/2021  Decreased Interest 3  Down, Depressed, Hopeless 2  PHQ - 2 Score 5  Altered sleeping 1  Tired, decreased energy 1  Change in appetite 3  Feeling bad or failure about yourself  2  Trouble concentrating 1  Moving slowly or fidgety/restless 1  Suicidal thoughts 1  PHQ-9 Score 15  Difficult doing work/chores Somewhat difficult   Assessment/Plan:   1. Other fatigue [name] admits to daytime  somnolence and admits to waking up still tired. Patent has a history of symptoms of daytime fatigue, morning fatigue, morning headache, and snoring. [name] generally gets 4 or 5 hours of sleep per night, and states that he has poor sleep quality. Snoring is present. Apneic episodes are present. Epworth Sleepiness Score is 21.  Jawone does feel that his weight is causing his energy to be lower than it should be. Fatigue may be related to obesity, depression or many other causes. Labs will be ordered, and in the meanwhile, Dennies will focus on self care including making healthy food choices, increasing physical activity and focusing on stress reduction.  - EKG 12-Lead  2. SOB (shortness of breath) on exertion [name] notes increasing shortness of breath with exercising and seems to be worsening over time with weight gain. He notes getting out of breath sooner with activity than he used to. This has gotten worse recently. [name] denies shortness of breath at rest or orthopnea.  [name] does feel that he gets out of breath more easily that he used to when he exercises. [name]'s shortness of breath appears to be obesity related and exercise induced. He has agreed to work on weight loss and gradually increase exercise to treat his exercise induced shortness of breath. Will continue to monitor closely.  3. Essential hypertension Elevated.  He has not had any BP medication in 3 weeks. Medications: Micardis 80 mg daily.   Plan: Avoid buying foods that are: processed, frozen, or prepackaged to avoid excess salt. We will watch for signs of  hypotension as he continues lifestyle modifications.  BP Readings from Last 3 Encounters:  11/11/21 (!) 169/74  11/17/20 (!) 149/71  08/08/20 130/64   Lab Results  Component Value Date   CREATININE 1.13 11/14/2020   4. Coronary artery disease with angina pectoris, unspecified vessel or lesion type, unspecified whether native or transplanted heart [facility]) Followed by  Cardiology.  5. Other hyperlipidemia Course: Controlled. Lipid-lowering medications: Crestor 20 mg daily.   Plan: Dietary changes: Increase soluble fiber, decrease simple carbohydrates, decrease saturated fat. Exercise changes: Moderate to vigorous-intensity aerobic activity 150 minutes per week or as tolerated. We will continue to monitor along with PCP/specialists as it pertains to his weight loss journey.  Lab Results  Component Value Date   CHOL 136 11/14/2020   HDL 53 11/14/2020   LDLCALC 63 11/14/2020   LDLDIRECT 143.4 11/21/2012   TRIG 110 11/14/2020   CHOLHDL 2.6 11/14/2020   Lab Results  Component Value Date   ALT 28 11/14/2020   AST 22 11/14/2020   ALKPHOS 81 11/14/2020   BILITOT 0.6 11/14/2020   The 10-year ASCVD risk score (Arnett DK, et al., 2019) is: 30.5%   Values used to calculate the score:     Age: 57 years     Sex: Male     Is Non-Hispanic African American: No     Diabetic: No     Tobacco smoker: No     Systolic Blood Pressure: 123XX123 mmHg     Is BP treated: Yes     HDL Cholesterol: 53 mg/dL     Total Cholesterol: 136 mg/dL  6. OSA (obstructive sleep apnea) He is getting a new CPAP.  Needs new prescription written for this.    OSA is a cause of systemic hypertension and is associated with an increased incidence of stroke, heart failure, atrial fibrillation, and coronary heart disease. Severe OSA increases all-cause mortality and cardiovascular mortality.   Goal: Treatment of OSA via CPAP compliance and weight loss. Plasma ghrelin levels (appetite or "hunger hormone") are significantly higher in OSA patients than in BMI-matched controls, but decrease to levels similar to those of obese patients without OSA after CPAP treatment.  Weight loss improves OSA by several mechanisms, including reduction in fatty tissue in the throat (i.e. parapharyngeal fat) and the tongue. Loss of abdominal fat increases mediastinal traction on the upper airway making it less likely  to collapse during sleep. Studies have also shown that compliance with CPAP treatment improves leptin (hunger inhibitory hormone) imbalance.  7. Visceral obesity Current visceral fat rating: 27. Visceral fat rating goal is < 13. Visceral adipose tissue is a hormonally active component of total body fat. This body composition phenotype is associated with medical disorders such as metabolic syndrome, cardiovascular disease, and several malignancies including prostate, breast, and colorectal cancers. Starting goal: Lose 7-10% of starting weight.   8. Multiple joint pain/OA Followed by Raliegh Ip.  Taking diclofenac.  Plan:  Referral placed for PT Pilates.  9. Restrictive lung disease Uses Ventolin inhaler.  10. Impaired fasting glucose, with polyphagia Not at goal. Current treatment: Ozempic 0.5 mg subcutaneously weekly.    Plan:  Continue Ozempic 0.5 mg subcutaneously weekly.  Will refill today.  He will continue to focus on protein-rich, low simple carbohydrate foods. We reviewed the importance of hydration, regular exercise for stress reduction, and restorative sleep.  - Refill Semaglutide,0.25 or 0.5MG /DOS, (OZEMPIC, 0.25 OR 0.5 MG/DOSE,) 2 MG/1.5ML SOPN; Inject 0.5 mg into the skin once a week.  Dispense: 1.5  mL; Refill: 0  11. Venous stasis dermatitis, unspecified laterality Will continue to monitor as it relates to his weight loss journey.  12. Bipolar affective disorder, remission status unspecified ([place]) Taking lithium 450 mg twice daily Lamictal 200 mg daily.  Off Lamictal for 3 weeks.  When he does not take this, he has less sedation and less need for modafinil.  13. Other depression, with emotional eating Not at goal. Medication: None.   Plan: Discussed cues and consequences, how thoughts affect eating, model of thoughts, feelings, and behaviors, and strategies for change by focusing on the cue. Discussed cognitive distortions, coping thoughts, and how to change your  thoughts.  14. Class 3 severe obesity with serious comorbidity and body mass index (BMI) of 40.0 to 44.9 in adult, unspecified obesity type [facility])  Rani is currently in the action stage of change and his goal is to continue with weight loss efforts. I recommend Yosbel begin the structured treatment plan as follows:  He has agreed to the Category 4 Plan.  Exercise goals: No exercise has been prescribed at this time.   Behavioral modification strategies: increasing lean protein intake, decreasing simple carbohydrates, increasing vegetables, increasing water intake, decreasing liquid calories, decreasing alcohol intake, decreasing sodium intake, and increasing high fiber foods.  He was informed of the importance of frequent follow-up visits to maximize his success with intensive lifestyle modifications for his multiple health conditions. He was informed we would discuss his lab results at his next visit unless there is a critical issue that needs to be addressed sooner. Albin agreed to keep his next visit at the agreed upon time to discuss these results.  Objective:   Blood pressure (!) 169/74, pulse 76, temperature (!) 96 F (35.6 C), temperature source Oral, height 6' (1.829 m), weight 298 lb (135.2 kg), SpO2 95 %. Body mass index is 40.42 kg/m.  EKG: Normal sinus rhythm, rate 72 bpm.  Indirect Calorimeter completed today shows a VO2 of 362 and a REE of 2506.  His calculated basal metabolic rate is [id number] thus his basal metabolic rate is better than expected.  General: Cooperative, alert, well developed, in no acute distress. HEENT: Conjunctivae and lids unremarkable. Cardiovascular: Regular rhythm.  Lungs: Normal work of breathing. Neurologic: No focal deficits.   Lab Results  Component Value Date   CREATININE 1.13 11/14/2020   BUN 17 11/14/2020   NA 139 11/14/2020   K 4.4 11/14/2020   CL 104 11/14/2020   CO2 23 11/14/2020   Lab Results  Component Value Date   ALT 28  11/14/2020   AST 22 11/14/2020   ALKPHOS 81 11/14/2020   BILITOT 0.6 11/14/2020   Lab Results  Component Value Date   HGBA1C 5.0 01/30/2015   HGBA1C 4.8 11/21/2012   HGBA1C 5.6 05/27/2010   HGBA1C 5.5 04/03/2009   HGBA1C 5.7 08/09/2008   Lab Results  Component Value Date   TSH 2.24 01/28/2015   Lab Results  Component Value Date   CHOL 136 11/14/2020   HDL 53 11/14/2020   LDLCALC 63 11/14/2020   LDLDIRECT 143.4 11/21/2012   TRIG 110 11/14/2020   CHOLHDL 2.6 11/14/2020   Lab Results  Component Value Date   WBC 6.7 11/21/2012   HGB 15.6 11/21/2012   HCT 45.3 11/21/2012   MCV 92.5 11/21/2012   PLT 194.0 11/21/2012   Attestation Statements:   This is the patient's first visit at [facility]. The patient's NEW PATIENT PACKET was reviewed at length. Included in  the packet: current and past health history, medications, allergies, ROS, gynecologic history (women only), surgical history, family history, social history, weight history, weight loss surgery history (for those that have had weight loss surgery), nutritional evaluation, mood and food questionnaire, PHQ9, Epworth questionnaire, sleep habits questionnaire, patient life and health improvement goals questionnaire. These will all be scanned into the patient's chart under media.   During the visit, I independently reviewed the patient's EKG, bioimpedance scale results, and indirect calorimeter results. I used this information to tailor a meal plan for the patient that will help him to lose weight and will improve his obesity-related conditions going forward. I performed a medically necessary appropriate examination and/or evaluation. I discussed the assessment and treatment plan with the patient. The patient was provided an opportunity to ask questions and all were answered. The patient agreed with the plan and demonstrated an understanding of the instructions. Labs were ordered at this visit and will be reviewed at  the next visit unless more critical results need to be addressed immediately. Clinical information was updated and documented in the EMR.   I, Water quality scientist, CMA, am acting as transcriptionist for Briscoe Deutscher, DO  I have reviewed the above documentation for accuracy and completeness, and I agree with the above. - Briscoe Deutscher, DO, MS, FAAFP, DABOM - Family and Bariatric Medicine.

## 2021-11-17 NOTE — Telephone Encounter (Signed)
RX sent

## 2021-11-17 NOTE — Telephone Encounter (Signed)
Done PA for [facility] and response was" Prior Authorization not required for patient/medication

## 2021-12-02 ENCOUNTER — Ambulatory Visit: Payer: PPO | Admitting: Physical Therapy

## 2021-12-02 ENCOUNTER — Ambulatory Visit (INDEPENDENT_AMBULATORY_CARE_PROVIDER_SITE_OTHER): Payer: PPO | Admitting: Family Medicine

## 2021-12-08 ENCOUNTER — Telehealth (INDEPENDENT_AMBULATORY_CARE_PROVIDER_SITE_OTHER): Payer: Self-pay | Admitting: Family Medicine

## 2021-12-08 ENCOUNTER — Other Ambulatory Visit (INDEPENDENT_AMBULATORY_CARE_PROVIDER_SITE_OTHER): Payer: Self-pay | Admitting: Family Medicine

## 2021-12-08 NOTE — Telephone Encounter (Signed)
[facility] called to request labs that patient will have done here 2/15 be faxed to [name] . Their office number is [phone] and fax number [phone].

## 2021-12-09 ENCOUNTER — Encounter (INDEPENDENT_AMBULATORY_CARE_PROVIDER_SITE_OTHER): Payer: Self-pay | Admitting: Family Medicine

## 2021-12-09 ENCOUNTER — Other Ambulatory Visit: Payer: Self-pay

## 2021-12-09 ENCOUNTER — Ambulatory Visit (INDEPENDENT_AMBULATORY_CARE_PROVIDER_SITE_OTHER): Payer: PPO | Admitting: Family Medicine

## 2021-12-09 ENCOUNTER — Ambulatory Visit: Payer: PPO | Attending: Family Medicine | Admitting: Physical Therapy

## 2021-12-09 ENCOUNTER — Encounter: Payer: Self-pay | Admitting: Physical Therapy

## 2021-12-09 VITALS — BP 149/75 | HR 76 | Temp 97.6°F | Ht 72.0 in | Wt 293.0 lb

## 2021-12-09 DIAGNOSIS — H9193 Unspecified hearing loss, bilateral: Secondary | ICD-10-CM | POA: Diagnosis not present

## 2021-12-09 DIAGNOSIS — Z6839 Body mass index (BMI) 39.0-39.9, adult: Secondary | ICD-10-CM

## 2021-12-09 DIAGNOSIS — F319 Bipolar disorder, unspecified: Secondary | ICD-10-CM | POA: Diagnosis not present

## 2021-12-09 DIAGNOSIS — I10 Essential (primary) hypertension: Secondary | ICD-10-CM

## 2021-12-09 DIAGNOSIS — E669 Obesity, unspecified: Secondary | ICD-10-CM

## 2021-12-09 DIAGNOSIS — G8929 Other chronic pain: Secondary | ICD-10-CM | POA: Diagnosis not present

## 2021-12-09 DIAGNOSIS — M2550 Pain in unspecified joint: Secondary | ICD-10-CM | POA: Diagnosis not present

## 2021-12-09 DIAGNOSIS — R5382 Chronic fatigue, unspecified: Secondary | ICD-10-CM

## 2021-12-09 DIAGNOSIS — M25511 Pain in right shoulder: Secondary | ICD-10-CM | POA: Insufficient documentation

## 2021-12-09 DIAGNOSIS — M25512 Pain in left shoulder: Secondary | ICD-10-CM | POA: Insufficient documentation

## 2021-12-09 DIAGNOSIS — N529 Male erectile dysfunction, unspecified: Secondary | ICD-10-CM

## 2021-12-09 DIAGNOSIS — R29898 Other symptoms and signs involving the musculoskeletal system: Secondary | ICD-10-CM | POA: Insufficient documentation

## 2021-12-09 DIAGNOSIS — E7849 Other hyperlipidemia: Secondary | ICD-10-CM | POA: Diagnosis not present

## 2021-12-09 DIAGNOSIS — R7301 Impaired fasting glucose: Secondary | ICD-10-CM

## 2021-12-09 DIAGNOSIS — J984 Other disorders of lung: Secondary | ICD-10-CM

## 2021-12-09 DIAGNOSIS — Z6841 Body Mass Index (BMI) 40.0 and over, adult: Secondary | ICD-10-CM

## 2021-12-09 MED ORDER — LITHIUM CARBONATE ER 450 MG PO TBCR: 450.0000 mg | EXTENDED_RELEASE_TABLET | Freq: Two times a day (BID) | ORAL | 2 refills | Status: AC

## 2021-12-09 MED ORDER — MODAFINIL 200 MG PO TABS: 200.0000 mg | ORAL_TABLET | Freq: Every day | ORAL | 0 refills | Status: DC

## 2021-12-09 MED ORDER — TIRZEPATIDE 5 MG/0.5ML ~~LOC~~ SOAJ: 5.0000 mg | SUBCUTANEOUS | 1 refills | Status: AC

## 2021-12-09 MED ORDER — ROSUVASTATIN CALCIUM 10 MG PO TABS: 20.0000 mg | ORAL_TABLET | Freq: Every day | ORAL | 3 refills | Status: AC

## 2021-12-09 MED ORDER — TELMISARTAN 80 MG PO TABS: 80.0000 mg | ORAL_TABLET | Freq: Every day | ORAL | 0 refills | Status: AC

## 2021-12-09 MED ORDER — MONTELUKAST SODIUM 10 MG PO TABS: 10.0000 mg | ORAL_TABLET | Freq: Every day | ORAL | 2 refills | Status: AC

## 2021-12-09 NOTE — Therapy (Signed)
OUTPATIENT PHYSICAL THERAPY LOWER EXTREMITY EVALUATION   Patient Name: [name] MRN: [id number] DOB:[birth year], 71 y.o., male Today's Date: 12/09/2021   PT End of Session - 12/09/21 1108     Visit Number 1    Number of Visits 8    Date for PT Re-Evaluation 03/03/22    Authorization Type [employer]    PT Start Time 1100    PT Stop Time 1150    PT Time Calculation (min) 50 min    Activity Tolerance Patient tolerated treatment well    Behavior During Therapy [facility] for tasks assessed/performed             Past Medical History:  Diagnosis Date   Anxiety    Arthritis    Bipolar disorder ([place])    Claustrophobia    Coronary artery calcification    Grief    Hypertension    Lower extremity edema    Male erectile disorder    Non-cardiac chest pain 10/25/2006   and arm pain; Dr. Eustace Oliver.   Obesity    Other and unspecified hyperlipidemia    NMR 2006: LDL 206(2954/1907), HDL 43, TG 82. LDL goal = <100. Framingham study LDL goal =  < 130   Reactive airway disease    Sleep apnea    SOB (shortness of breath)    Past Surgical History:  Procedure Laterality Date   CARDIAC CATHETERIZATION  10/25/2006   negative; Dr [name]   COLONOSCOPY  10/26/2007   negative; Dr [name]   I&D L knee  10/26/2007   for post traumatic cellulitis/abscess   TONSILLECTOMY     TONSILLECTOMY AND ADENOIDECTOMY     Patient Active Problem List   Diagnosis Date Noted   Morbid obesity ([place]) 03/10/2018   OSA (obstructive sleep apnea) 03/10/2018   OSA on CPAP 08/01/2015   Obesity 08/01/2015   Nonspecific abnormal electrocardiogram (ECG) (EKG) 12/25/2014   Palpitations 04/19/2012   Hyperlipidemia 05/29/2010   ASTHMA 05/29/2010   MALAISE 06/24/2009   Essential hypertension 04/11/2007   SYMPTOM, APNEA, SLEEP NOS 04/11/2007    PCP: Anthony Infante, MD  REFERRING PROVIDER: Briscoe Deutscher, DO  REFERRING DIAG: Multiple joint pain   THERAPY DIAG:  Chronic left shoulder pain  Chronic  right shoulder pain  Other symptoms and signs involving the musculoskeletal system  ONSET DATE: chronic   SUBJECTIVE:   SUBJECTIVE STATEMENT: [name] is here for PT today for a number of reasons.  He is currently involved in a [facility] weight management program.  He complains of intermittnent pain in a number of joints.  He has neck, shoulder, back, knee and ankle pain and stiffness .  He is working on losing weight and has so far reporteed less overall LE joint pain as a result  His wife passed in March 2022 and is coping as best he can. Since the time has has taken care of her , he stopped working out and let his health take a backseat to hers.  He ios ready to make positive changes.  He feels overall weaker than he used to be.  He no longer plays tennis but would like to try to get back into strength training, both in effort to improve mobility but to lose weight as well.    PERTINENT HISTORY: Neck pain C7, lumbar arthritis , ankles and knees , bilateral shoulder pain /tendonitis , Rt elbow  Normal sed rate.  CRP normal. , depression  and bipolar  PAIN:  Are you having pain? No  NPRS scale: none/10. Can be moderate with working.   Pain location: shoulder  Pain orientation: Right and Left  PAIN TYPE: aching Pain description: aching, stiffness   Aggravating factors: lifting , overuse   Relieving factors: rest    PRECAUTIONS: None  WEIGHT BEARING RESTRICTIONS No  FALLS:  Has patient fallen in last 6 months? 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  Spends time in [place] and spends every other week there   PLOF: Independent, Leisure: travel, friends, 2nd business in [place], and widower  PATIENT GOALS : Less shoulder pain and to improve appearance, get stronger   OBJECTIVE:    DIAGNOSTIC FINDINGS: none recent   PATIENT SURVEYS:  FOTO NT will do next visit  COGNITION:  Overall cognitive status: Within functional limits for tasks assessed     SENSATION:  Light touch: Appears intact  Stereognosis: Appears intact  Hot/Cold: Appears intact  Proprioception: Appears intact  POSTURE:  Slightly forward head, rounded shoulders Quite tall, well appearing with good muscle tone   PALPATION: Min tenderness posterior shoulders   LE AROM/PROM:    WNL bilateral LEs   UPPER EXTREMITY AROM/PROM:  A/PROM Right 12/09/2021 Left 12/09/2021  Shoulder flexion WNL WNL  Shoulder extension WNL WNL  Shoulder abduction Pain > 100 deg  Pain > 100 deg   Shoulder adduction    Shoulder internal rotation FR to mid back FR to mid back   Shoulder external rotation FR to T3 FR to T2  Elbow flexion WNL WNL  Elbow ext WNL WNL  Wrist flexion    Wrist extension    Wrist ulnar deviation    Wrist radial deviation    Wrist pronation    Wrist supination    (Blank rows = not tested)  UPPER EXTREMITY MMT:  MMT Right 12/09/2021 Left 12/09/2021  Shoulder flexion 5/5 5/5  Shoulder extension    Shoulder abduction 4/5 5/5  Shoulder adduction    Shoulder internal rotation 5/5 5/5  Shoulder external rotation 5/5 5/5  Middle trapezius    Lower trapezius    Elbow flexion 5/5 5/5  Elbow extension    Wrist flexion    Wrist extension    Wrist ulnar deviation    Wrist radial deviation    Wrist pronation    Wrist supination    Grip strength (lbs)    (Blank rows = not tested)  SHOULDER SPECIAL TESTS:  Impingement tests: Neer impingement test: positive  bilateral  SLAP lesions:  NT  Instability tests:  NT  Rotator cuff assessment: Empty can test: positive   Biceps assessment:  NT  JOINT MOBILITY TESTING:  Normal  in shoulders  LE MMT:  MMT Right 12/09/2021 Left 12/09/2021  Hip flexion 4/5 4/5  Hip extension    Hip abduction    Hip adduction    Hip internal rotation     Hip external rotation    Knee flexion 5/5 5/5  Knee extension 5/5 5/5  Ankle dorsiflexion    Ankle plantarflexion    Ankle inversion    Ankle eversion     (Blank rows = not tested)  LOWER EXTREMITY SPECIAL TESTS:  NT  FUNCTIONAL TESTS:  NT  GAIT: Distance walked: 150 Assistive device utilized: None Level of assistance: Complete Independence Comments: no deviations noted  TODAY'S TREATMENT: Extensive history taken.  Discussed overall physical condition, his weight loss goals.  Exercise selection and programming to match his goals.  Plan of care. Modalities for improving fitness (Aquatics, Pilates, weights) and muscle fiber types. ,posture and shoulder pain . Depression and link to pain .    PATIENT EDUCATION:  Education details: See above  Person educated: Patient Education method: Customer service manager Education comprehension: verbalized understanding   HOME EXERCISE PROGRAM: None yet.   ASSESSMENT:  CLINICAL IMPRESSION: Patient is a 71 y.o. male who was seen today for physical therapy evaluation and treatment for shoulder pain and overall multi-joint pain. Objective impairments include decreased mobility, decreased strength, increased edema, postural dysfunction, obesity, pain, and energy . These impairments are limiting patient from community activity, occupation, yard work, and recreation . Personal factors including Time since onset of injury/illness/exacerbation, 1-2 comorbidities: depression/recent loss of wife, and multi joint pain  are also affecting patient's functional outcome. Patient will benefit from skilled PT to address above impairments and improve overall function.  REHAB POTENTIAL: Excellent  CLINICAL DECISION MAKING: Stable/uncomplicated  EVALUATION COMPLEXITY: Low   GOALS: Goals reviewed with patient? No  SHORT TERM GOALS:  STG Name Target Date Goal status  1 Pt will be I with HEP for core, UE and LE strength and trunk  flexibility  Baseline:  01/20/2022 INITIAL  2 Pt will notice continued trend towards weight loss and improved ease of movement in general (transfers, walking and stairs) Baseline:  01/20/2022 INITIAL  3 Pt will understand body mechanics and posture as it pertains to standing, sitting. Baseline: 01/20/2022 INITIAL                       LONG TERM GOALS:   LTG Name Target Date Goal status  1 Pt will be I with HEP for overall strength, balance and flexibility  Baseline: 03/03/2022 INITIAL  2 Pt will be able to lift and carry items without increasing shoulder or back pain  Baseline: 03/03/2022 INITIAL  3 Pt will be able to perform cardio/walking for 30 min or more without limitation of LE and back pain  Baseline: 03/03/2022 INITIAL  4 Pt will return to UE involved sports without increased pain > 50% of the time  Baseline: 03/03/2022 INITIAL  5 Balance TBA  Baseline: 03/03/2022 INITIAL             PLAN: PT FREQUENCY: 1 x  every other week  PT DURATION: 12 weeks  PLANNED INTERVENTIONS: Therapeutic exercises, Therapeutic activity, Neuro Muscular re-education, Balance training, Gait training, Patient/Family education, Joint mobilization, Aquatic Therapy, Dry Needling, Cryotherapy, Moist heat, Ionotophoresis 4mg /ml Dexamethasone, and Manual therapy  PLAN FOR NEXT SESSION: HEP shoulders, Pilates.    Delena Casebeer, PT 12/09/2021, 4:00 PM

## 2021-12-10 ENCOUNTER — Encounter (INDEPENDENT_AMBULATORY_CARE_PROVIDER_SITE_OTHER): Payer: Self-pay

## 2021-12-10 DIAGNOSIS — F39 Unspecified mood [affective] disorder: Secondary | ICD-10-CM | POA: Diagnosis not present

## 2021-12-10 DIAGNOSIS — E785 Hyperlipidemia, unspecified: Secondary | ICD-10-CM | POA: Diagnosis not present

## 2021-12-10 DIAGNOSIS — I10 Essential (primary) hypertension: Secondary | ICD-10-CM | POA: Diagnosis not present

## 2021-12-10 DIAGNOSIS — E291 Testicular hypofunction: Secondary | ICD-10-CM | POA: Diagnosis not present

## 2021-12-10 MED ORDER — MOUNJARO 7.5 MG/0.5ML ~~LOC~~ SOAJ: 7.5000 mg | SUBCUTANEOUS | 3 refills | Status: AC

## 2021-12-10 MED ORDER — LORAZEPAM 1 MG PO TABS: 1.0000 mg | ORAL_TABLET | Freq: Every day | ORAL | 0 refills | Status: AC | PRN

## 2021-12-10 MED ORDER — TADALAFIL 5 MG PO TABS: 5.0000 mg | ORAL_TABLET | Freq: Every day | ORAL | 11 refills | Status: AC

## 2021-12-10 MED ORDER — MOUNJARO 10 MG/0.5ML ~~LOC~~ SOAJ: 10.0000 mg | SUBCUTANEOUS | 3 refills | Status: AC

## 2021-12-10 NOTE — Telephone Encounter (Signed)
Pt did not have labs done at [facility]
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
## 2021-12-29 DIAGNOSIS — F3132 Bipolar disorder, current episode depressed, moderate: Secondary | ICD-10-CM | POA: Diagnosis not present

## 2022-01-05 NOTE — Therapy (Addendum)
?OUTPATIENT PHYSICAL THERAPY TREATMENT NOTE ?ADDENDED DISCHARGE ? ? ?Patient Name: [name] ?MRN: [id number] ?DOB:[birth year], 71 y.o., male ?Today's Date: 01/06/2022 ? ?PCP: [name], MD ?REFERRING PROVIDER: Helane Rima, DO ? ? PT End of Session - 01/06/22 1020   ? ? Visit Number 2   ? Number of Visits 8   ? Date for PT Re-Evaluation 03/03/22   ? Authorization Type [employer]   ? PT Start Time 1017   ? PT Stop Time 1102   ? PT Time Calculation (min) 45 min   ? Activity Tolerance Patient tolerated treatment well   ? Behavior During Therapy [facility] for tasks assessed/performed   ? ?  ?  ? ?  ? ? ?Past Medical History:  ?Diagnosis Date  ? Anxiety   ? Arthritis   ? Bipolar disorder (HCC)   ? Claustrophobia   ? Coronary artery calcification   ? Grief   ? Hypertension   ? Lower extremity edema   ? Male erectile disorder   ? Non-cardiac chest pain 10/25/2006  ? and arm pain; Dr. Charlies Constable.  ? Obesity   ? Other and unspecified hyperlipidemia   ? NMR 2006: LDL 206(2954/1907), HDL 43, TG 82. LDL goal = <100. Framingham study LDL goal =  < 130  ? Reactive airway disease   ? Sleep apnea   ? SOB (shortness of breath)   ? ?Past Surgical History:  ?Procedure Laterality Date  ? CARDIAC CATHETERIZATION  10/25/2006  ? negative; Dr Juanda Chance  ? COLONOSCOPY  10/26/2007  ? negative; Dr Arlyce Dice  ? I&D L knee  10/26/2007  ? for post traumatic cellulitis/abscess  ? TONSILLECTOMY    ? TONSILLECTOMY AND ADENOIDECTOMY    ? ?Patient Active Problem List  ? Diagnosis Date Noted  ? Morbid obesity (HCC) 03/10/2018  ? OSA (obstructive sleep apnea) 03/10/2018  ? OSA on CPAP 08/01/2015  ? Obesity 08/01/2015  ? Nonspecific abnormal electrocardiogram (ECG) (EKG) 12/25/2014  ? Palpitations 04/19/2012  ? Hyperlipidemia 05/29/2010  ? ASTHMA 05/29/2010  ? MALAISE 06/24/2009  ? Essential hypertension 04/11/2007  ? SYMPTOM, APNEA, SLEEP NOS 04/11/2007  ? ? ?REFERRING DIAG: Multiple joint pain  ? ?THERAPY DIAG:  ?Chronic left shoulder  pain ? ?Chronic right shoulder pain ? ?Other symptoms and signs involving the musculoskeletal system ? ?PERTINENT HISTORY: see eval  ? ?PRECAUTIONS: none  ? ?SUBJECTIVE: Im doing OK except that out of the blue I will have multi-joint pain for no known reason  ? ?PAIN:  ?Are you having pain? Yes: NPRS scale: 3/10 ?Pain location: bilateral shoulders ?Pain description: sore, weak ?Aggravating factors: not sure  ?Relieving factors: not sure  ? ? ? ? ?DIAGNOSTIC FINDINGS: none recent  ?  ?PATIENT SURVEYS:  ?FOTO NT will do next visit ?  ?COGNITION: ?         Overall cognitive status: Within functional limits for tasks assessed              ?          ?SENSATION: ?         Light touch: Appears intact ?         Stereognosis: Appears intact ?         Hot/Cold: Appears intact ?         Proprioception: Appears intact ?  ?POSTURE:  ?Slightly forward head, rounded shoulders ?Quite tall, well appearing with good muscle tone  ?  ?PALPATION: ?Min tenderness posterior shoulders  ?  ?LE AROM/PROM: ?  ?  WNL bilateral LEs ?  ?  ?UPPER EXTREMITY AROM/PROM: ?  ?A/PROM Right ?12/09/2021 Left ?12/09/2021  ?Shoulder flexion WNL WNL  ?Shoulder extension WNL WNL  ?Shoulder abduction Pain > 100 deg  Pain > 100 deg   ?Shoulder adduction      ?Shoulder internal rotation FR to mid back FR to mid back   ?Shoulder external rotation FR to T3 FR to T2  ?Elbow flexion WNL WNL  ?Elbow ext WNL WNL  ?Wrist flexion      ?Wrist extension      ?Wrist ulnar deviation      ?Wrist radial deviation      ?Wrist pronation      ?Wrist supination      ?(Blank rows = not tested) ?  ?UPPER EXTREMITY MMT: ?  ?MMT Right ?12/09/2021 Left ?12/09/2021  ?Shoulder flexion 5/5 5/5  ?Shoulder extension      ?Shoulder abduction 4/5 5/5  ?Shoulder adduction      ?Shoulder internal rotation 5/5 5/5  ?Shoulder external rotation 5/5 5/5  ?Middle trapezius      ?Lower trapezius      ?Elbow flexion 5/5 5/5  ?Elbow extension      ?Wrist flexion      ?Wrist extension       ?Wrist ulnar deviation      ?Wrist radial deviation      ?Wrist pronation      ?Wrist supination      ?Grip strength (lbs)      ?(Blank rows = not tested) ?  ?SHOULDER SPECIAL TESTS: ?          Impingement tests: Neer impingement test: positive  bilateral ?          SLAP lesions:  NT ?          Instability tests:  NT ?          Rotator cuff assessment: Empty can test: positive  ?          Biceps assessment:  NT ?  ?JOINT MOBILITY TESTING:  ?Normal  in shoulders  ?LE MMT: ?  ?MMT Right ?12/09/2021 Left ?12/09/2021  ?Hip flexion 4/5 4/5  ?Hip extension      ?Hip abduction      ?Hip adduction      ?Hip internal rotation      ?Hip external rotation      ?Knee flexion 5/5 5/5  ?Knee extension 5/5 5/5  ?Ankle dorsiflexion      ?Ankle plantarflexion      ?Ankle inversion      ?Ankle eversion      ? (Blank rows = not tested) ?  ?LOWER EXTREMITY SPECIAL TESTS:  ?NT ?  ?FUNCTIONAL TESTS:  ?NT ?  ?GAIT: ?Distance walked: 150 ?Assistive device utilized: None ?Level of assistance: Complete Independence ?Comments: no deviations noted  ?  ?  ?  ?TODAY'S TREATMENT: ?Extensive history taken.  Discussed overall physical condition, his weight loss goals.  Exercise selection and programming to match his goals.  Plan of care. Modalities for improving fitness (Aquatics, Pilates, weights) and muscle fiber types. ,posture and shoulder pain . Depression and link to pain .  ?  ? [facility]                                                DATE: 01/06/22 ?Therapeutic  Exercise: ? Pilates Tower for LE/Core strength, postural strength, lumbopelvic disassociation and core control.   ? ?Exercises included: ?Transverse Abdominus with light ball squeeze x 10 breathing  ?[place] yellow arcs x 10 in parallel  ?Abduction arcs x 10  ? ?Bridging x 8 articulation ?arm arcs x 10 for core  ?Tall kneeling  ? Shoulder extension x 10  ? Row x 10  ?Seated on long box shoulder flexion x 10 ? Fly x 10 ? ?Theraband for HEP: ? Horizontal abduction  black band on wall x 15 ? ER  black x 10 ? Single arm lat pull down x 10 each  ?Self Care: ?Ball on the wall ?TRX and HEP, pilates  ?  ?PATIENT EDUCATION:  ?Education details: See above  ?Person educated: Patient ?Education method: Explanation and Demonstration ?Education comprehension: verbalized understanding ?  ?  ?HOME EXERCISE PROGRAM: ?None yet.  ?  ?ASSESSMENT: ?  ?CLINICAL IMPRESSION: ?Patient has not been back to PT a month.  He has since gotten a trainer and is doing TRX.  He was given an HEP he can take with him for core and postural strength.  He needs redirection to stay focused on the session.  He is interested in Pilates and so next session we will do the Reformer and include lower body.  He felt good after , no complaint of shoulder pain .  ? ?REHAB POTENTIAL: Excellent ?  ?CLINICAL DECISION MAKING: Stable/uncomplicated ?  ?EVALUATION COMPLEXITY: Low ?  ?  ?GOALS: ?Goals reviewed with patient? No ?  ?SHORT TERM GOALS: ?  ?STG Name Target Date Goal status  ?1 Pt will be I with HEP for core, UE and LE strength and trunk flexibility  ?Baseline:  01/20/2022 INITIAL  ?2 Pt will notice continued trend towards weight loss and improved ease of movement in general (transfers, walking and stairs) ?Baseline:  01/20/2022 INITIAL  ?3 Pt will understand body mechanics and posture as it pertains to standing, sitting. ?Baseline: 01/20/2022 INITIAL  ?         ?         ?         ?         ?  ?LONG TERM GOALS:  ?  ?LTG Name Target Date Goal status  ?1 Pt will be I with HEP for overall strength, balance and flexibility  ?Baseline: 03/03/2022 INITIAL  ?2 Pt will be able to lift and carry items without increasing shoulder or back pain  ?Baseline: 03/03/2022 INITIAL  ?3 Pt will be able to perform cardio/walking for 30 min or more without limitation of LE and back pain  ?Baseline: 03/03/2022 INITIAL  ?4 Pt will return to UE involved sports without increased pain > 50% of the time  ?Baseline: 03/03/2022 INITIAL  ?5 Balance TBA   ?Baseline: 03/03/2022 INITIAL  ?         ?         ?  ?PLAN: ?PT FREQUENCY: 1 x  every other week ?  ?PT DURATION: 12 weeks ?  ?PLANNED INTERVENTIONS: Therapeutic exercises, Therapeutic activity, Neuro Muscular re-e

## 2022-01-06 ENCOUNTER — Encounter (INDEPENDENT_AMBULATORY_CARE_PROVIDER_SITE_OTHER): Payer: Self-pay | Admitting: Family Medicine

## 2022-01-06 ENCOUNTER — Ambulatory Visit: Payer: PPO | Attending: Family Medicine | Admitting: Physical Therapy

## 2022-01-06 ENCOUNTER — Encounter: Payer: Self-pay | Admitting: Physical Therapy

## 2022-01-06 ENCOUNTER — Other Ambulatory Visit: Payer: Self-pay

## 2022-01-06 DIAGNOSIS — M25511 Pain in right shoulder: Secondary | ICD-10-CM | POA: Insufficient documentation

## 2022-01-06 DIAGNOSIS — M25512 Pain in left shoulder: Secondary | ICD-10-CM | POA: Insufficient documentation

## 2022-01-06 DIAGNOSIS — R29898 Other symptoms and signs involving the musculoskeletal system: Secondary | ICD-10-CM | POA: Diagnosis not present

## 2022-01-06 DIAGNOSIS — G8929 Other chronic pain: Secondary | ICD-10-CM | POA: Diagnosis not present

## 2022-01-13 ENCOUNTER — Encounter (INDEPENDENT_AMBULATORY_CARE_PROVIDER_SITE_OTHER): Payer: Self-pay | Admitting: Family Medicine

## 2022-01-13 ENCOUNTER — Other Ambulatory Visit: Payer: Self-pay

## 2022-01-13 ENCOUNTER — Ambulatory Visit (INDEPENDENT_AMBULATORY_CARE_PROVIDER_SITE_OTHER): Payer: PPO | Admitting: Family Medicine

## 2022-01-13 VITALS — BP 130/73 | HR 79 | Temp 97.9°F | Ht 72.0 in | Wt 295.0 lb

## 2022-01-13 DIAGNOSIS — F3178 Bipolar disorder, in full remission, most recent episode mixed: Secondary | ICD-10-CM | POA: Diagnosis not present

## 2022-01-13 DIAGNOSIS — E669 Obesity, unspecified: Secondary | ICD-10-CM

## 2022-01-13 DIAGNOSIS — Z6841 Body Mass Index (BMI) 40.0 and over, adult: Secondary | ICD-10-CM | POA: Diagnosis not present

## 2022-01-13 DIAGNOSIS — R7301 Impaired fasting glucose: Secondary | ICD-10-CM | POA: Diagnosis not present

## 2022-01-13 DIAGNOSIS — M2550 Pain in unspecified joint: Secondary | ICD-10-CM

## 2022-01-14 DIAGNOSIS — F3132 Bipolar disorder, current episode depressed, moderate: Secondary | ICD-10-CM | POA: Diagnosis not present

## 2022-01-19 ENCOUNTER — Other Ambulatory Visit: Payer: Self-pay | Admitting: Internal Medicine

## 2022-01-19 ENCOUNTER — Ambulatory Visit (HOSPITAL_COMMUNITY)
Admission: RE | Admit: 2022-01-19 | Discharge: 2022-01-19 | Disposition: A | Discharge status: Home / Self Care | Payer: PPO | Source: Ambulatory Visit | Attending: Internal Medicine | Admitting: Internal Medicine

## 2022-01-19 ENCOUNTER — Other Ambulatory Visit (HOSPITAL_COMMUNITY): Payer: Self-pay | Admitting: Internal Medicine

## 2022-01-19 DIAGNOSIS — R0602 Shortness of breath: Secondary | ICD-10-CM | POA: Diagnosis not present

## 2022-01-19 DIAGNOSIS — M25541 Pain in joints of right hand: Secondary | ICD-10-CM | POA: Diagnosis not present

## 2022-01-19 DIAGNOSIS — Z7689 Persons encountering health services in other specified circumstances: Secondary | ICD-10-CM | POA: Diagnosis not present

## 2022-01-19 DIAGNOSIS — F39 Unspecified mood [affective] disorder: Secondary | ICD-10-CM | POA: Diagnosis not present

## 2022-01-19 DIAGNOSIS — I129 Hypertensive chronic kidney disease with stage 1 through stage 4 chronic kidney disease, or unspecified chronic kidney disease: Secondary | ICD-10-CM | POA: Diagnosis not present

## 2022-01-19 DIAGNOSIS — N1831 Chronic kidney disease, stage 3a: Secondary | ICD-10-CM | POA: Diagnosis not present

## 2022-01-19 DIAGNOSIS — R7989 Other specified abnormal findings of blood chemistry: Secondary | ICD-10-CM | POA: Insufficient documentation

## 2022-01-19 DIAGNOSIS — R051 Acute cough: Secondary | ICD-10-CM | POA: Diagnosis not present

## 2022-01-19 DIAGNOSIS — M25542 Pain in joints of left hand: Secondary | ICD-10-CM | POA: Diagnosis not present

## 2022-01-19 DIAGNOSIS — M25512 Pain in left shoulder: Secondary | ICD-10-CM | POA: Diagnosis not present

## 2022-01-19 DIAGNOSIS — J069 Acute upper respiratory infection, unspecified: Secondary | ICD-10-CM | POA: Diagnosis not present

## 2022-01-19 DIAGNOSIS — M25511 Pain in right shoulder: Secondary | ICD-10-CM | POA: Diagnosis not present

## 2022-01-19 MED ORDER — IOHEXOL 350 MG/ML SOLN
80.0000 mL | Freq: Once | INTRAVENOUS | Status: AC | PRN
  Administered 2022-01-19: 80 mL via INTRAVENOUS

## 2022-01-20 ENCOUNTER — Ambulatory Visit: Payer: PPO | Admitting: Physical Therapy

## 2022-01-20 DIAGNOSIS — R7301 Impaired fasting glucose: Secondary | ICD-10-CM | POA: Diagnosis not present

## 2022-01-20 DIAGNOSIS — I10 Essential (primary) hypertension: Secondary | ICD-10-CM | POA: Diagnosis not present

## 2022-01-20 DIAGNOSIS — I2510 Atherosclerotic heart disease of native coronary artery without angina pectoris: Secondary | ICD-10-CM | POA: Diagnosis not present

## 2022-01-20 DIAGNOSIS — E669 Obesity, unspecified: Secondary | ICD-10-CM | POA: Diagnosis not present

## 2022-01-22 NOTE — Progress Notes (Signed)
Chief Complaint:   OBESITY [name] is here to discuss his progress with his obesity treatment plan along with follow-up of his obesity related diagnoses. See Medical Weight Management Flowsheet for complete bioelectrical impedance results.  Today's visit was #: 2 Starting weight: 298 lbs Starting date: 11/11/2021 Weight change since last visit: +2 lbs Total lbs lost to date: 3 lbs Total weight loss percentage to date: -1.01%  Nutrition Plan: Practicing portion control and making smarter food choices, such as increasing vegetables and decreasing simple carbohydrates for 80% of the time. Activity: Increased activity. Anti-obesity medications: Mounjaro 5 mg subcutaneously weekly . Reported side effects: None.  Interim History: [name] says he did not have his medications in [place].  He has not been taking his Lamictal or Provigil and feels better.  Assessment/Plan:   1. Impaired fasting glucose, with polyphagia Not at goal. Current treatment: Mounjaro 5 mg subcutaneously weekly.    Plan:  Continue Mounjaro 5 mg subcutaneously weekly. He will continue to focus on protein-rich, low simple carbohydrate foods. We reviewed the importance of hydration, regular exercise for stress reduction, and restorative sleep.  2. Multiple joint pain/OA Followed by [name].  Taking diclofenac.   Plan:  Continue to follow with [name].  3. Bipolar disorder, in full remission, most recent episode mixed ([name]) Taking lithium 450 mg twice daily Lamictal 200 mg daily, although he has not been taking either.  When he does not take this, he has less sedation and less need for modafinil.  4. Obesity with current BMI of 40  Course: [name] is currently in the action stage of change. As such, his goal is to continue with weight loss efforts.   Nutrition goals: He has agreed to practicing portion control and making smarter food choices, such as increasing vegetables and decreasing simple carbohydrates.    Exercise goals:  As is.  Behavioral modification strategies: increasing lean protein intake, decreasing simple carbohydrates, increasing vegetables, and increasing water intake.  [name] has agreed to follow-up with our clinic in 4-6 weeks. He was informed of the importance of frequent follow-up visits to maximize his success with intensive lifestyle modifications for his multiple health conditions.   Objective:   Blood pressure 130/73, pulse 79, temperature 97.9 F (36.6 C), temperature source Oral, height 6' (1.829 m), weight 295 lb (133.8 kg), SpO2 98 %. Body mass index is 40.01 kg/m.  General: Cooperative, alert, well developed, in no acute distress. HEENT: Conjunctivae and lids unremarkable. Cardiovascular: Regular rhythm.  Lungs: Normal work of breathing. Neurologic: No focal deficits.   Lab Results  Component Value Date   CREATININE 1.3 09/30/2021   BUN 17 09/30/2021   NA 138 09/30/2021   K 4.0 09/30/2021   CL 104 09/30/2021   CO2 20 09/30/2021   Lab Results  Component Value Date   ALT 28 11/14/2020   AST 27 09/30/2021   ALKPHOS 36 09/30/2021   BILITOT 0.6 11/14/2020   Lab Results  Component Value Date   HGBA1C 5.0 09/30/2021   HGBA1C 5.0 01/30/2015   HGBA1C 4.8 11/21/2012   HGBA1C 5.6 05/27/2010   HGBA1C 5.5 04/03/2009   Lab Results  Component Value Date   TSH 1.59 09/30/2021   Lab Results  Component Value Date   CHOL 154 03/02/2021   HDL 57 03/02/2021   LDLCALC 77 03/02/2021   LDLDIRECT 143.4 11/21/2012   TRIG 99 03/02/2021   CHOLHDL 2.6 11/14/2020   Lab Results  Component Value Date   WBC 7.7 09/30/2021  HGB 15.6 11/21/2012   HCT 45.3 11/21/2012   MCV 92.5 11/21/2012   PLT 194.0 11/21/2012   Attestation Statements:   Reviewed by clinician on day of visit: allergies, medications, problem list, medical history, surgical history, family history, social history, and previous encounter notes.  I, Water quality scientist, CMA, am acting as  transcriptionist for Briscoe Deutscher, DO  I have reviewed the above documentation for accuracy and completeness, and I agree with the above. -  Briscoe Deutscher, DO, MS, FAAFP, DABOM - Family and Bariatric Medicine.

## 2022-02-17 ENCOUNTER — Ambulatory Visit: Payer: PPO | Admitting: Physical Therapy

## 2022-03-03 ENCOUNTER — Ambulatory Visit: Payer: PPO | Admitting: Physical Therapy

## 2022-03-17 DIAGNOSIS — F3132 Bipolar disorder, current episode depressed, moderate: Secondary | ICD-10-CM | POA: Diagnosis not present

## 2022-04-04 DIAGNOSIS — M25572 Pain in left ankle and joints of left foot: Secondary | ICD-10-CM | POA: Diagnosis not present

## 2022-04-05 DIAGNOSIS — M7989 Other specified soft tissue disorders: Secondary | ICD-10-CM | POA: Diagnosis not present

## 2022-04-05 DIAGNOSIS — M19072 Primary osteoarthritis, left ankle and foot: Secondary | ICD-10-CM | POA: Diagnosis not present

## 2022-05-11 DIAGNOSIS — Z0000 Encounter for general adult medical examination without abnormal findings: Secondary | ICD-10-CM | POA: Diagnosis not present

## 2022-05-11 DIAGNOSIS — E291 Testicular hypofunction: Secondary | ICD-10-CM | POA: Diagnosis not present

## 2022-05-12 DIAGNOSIS — E785 Hyperlipidemia, unspecified: Secondary | ICD-10-CM | POA: Diagnosis not present

## 2022-05-12 DIAGNOSIS — Z125 Encounter for screening for malignant neoplasm of prostate: Secondary | ICD-10-CM | POA: Diagnosis not present

## 2022-05-12 DIAGNOSIS — R7301 Impaired fasting glucose: Secondary | ICD-10-CM | POA: Diagnosis not present

## 2022-05-12 DIAGNOSIS — I10 Essential (primary) hypertension: Secondary | ICD-10-CM | POA: Diagnosis not present

## 2022-05-18 DIAGNOSIS — E669 Obesity, unspecified: Secondary | ICD-10-CM | POA: Diagnosis not present

## 2022-05-18 DIAGNOSIS — R82998 Other abnormal findings in urine: Secondary | ICD-10-CM | POA: Diagnosis not present

## 2022-05-18 DIAGNOSIS — N1831 Chronic kidney disease, stage 3a: Secondary | ICD-10-CM | POA: Diagnosis not present

## 2022-05-18 DIAGNOSIS — E785 Hyperlipidemia, unspecified: Secondary | ICD-10-CM | POA: Diagnosis not present

## 2022-05-18 DIAGNOSIS — Z0000 Encounter for general adult medical examination without abnormal findings: Secondary | ICD-10-CM | POA: Diagnosis not present

## 2022-05-18 DIAGNOSIS — F39 Unspecified mood [affective] disorder: Secondary | ICD-10-CM | POA: Diagnosis not present

## 2022-06-01 DIAGNOSIS — H903 Sensorineural hearing loss, bilateral: Secondary | ICD-10-CM | POA: Diagnosis not present

## 2022-06-02 ENCOUNTER — Encounter (INDEPENDENT_AMBULATORY_CARE_PROVIDER_SITE_OTHER): Payer: Self-pay

## 2022-06-17 DIAGNOSIS — Z1211 Encounter for screening for malignant neoplasm of colon: Secondary | ICD-10-CM | POA: Diagnosis not present

## 2022-07-05 DIAGNOSIS — L821 Other seborrheic keratosis: Secondary | ICD-10-CM | POA: Diagnosis not present

## 2022-07-05 DIAGNOSIS — B001 Herpesviral vesicular dermatitis: Secondary | ICD-10-CM | POA: Diagnosis not present

## 2022-07-05 DIAGNOSIS — D1801 Hemangioma of skin and subcutaneous tissue: Secondary | ICD-10-CM | POA: Diagnosis not present

## 2022-07-05 DIAGNOSIS — L570 Actinic keratosis: Secondary | ICD-10-CM | POA: Diagnosis not present

## 2022-07-05 DIAGNOSIS — L814 Other melanin hyperpigmentation: Secondary | ICD-10-CM | POA: Diagnosis not present

## 2022-08-04 DIAGNOSIS — F3132 Bipolar disorder, current episode depressed, moderate: Secondary | ICD-10-CM | POA: Diagnosis not present

## 2022-09-27 DIAGNOSIS — R7301 Impaired fasting glucose: Secondary | ICD-10-CM | POA: Diagnosis not present

## 2022-09-27 DIAGNOSIS — M25571 Pain in right ankle and joints of right foot: Secondary | ICD-10-CM | POA: Diagnosis not present

## 2022-09-27 DIAGNOSIS — M25572 Pain in left ankle and joints of left foot: Secondary | ICD-10-CM | POA: Diagnosis not present

## 2022-09-27 DIAGNOSIS — M79671 Pain in right foot: Secondary | ICD-10-CM | POA: Diagnosis not present

## 2022-09-27 DIAGNOSIS — E785 Hyperlipidemia, unspecified: Secondary | ICD-10-CM | POA: Diagnosis not present

## 2022-09-27 DIAGNOSIS — M79672 Pain in left foot: Secondary | ICD-10-CM | POA: Diagnosis not present

## 2022-09-27 DIAGNOSIS — F39 Unspecified mood [affective] disorder: Secondary | ICD-10-CM | POA: Diagnosis not present

## 2022-09-27 DIAGNOSIS — I10 Essential (primary) hypertension: Secondary | ICD-10-CM | POA: Diagnosis not present

## 2022-11-10 DIAGNOSIS — L718 Other rosacea: Secondary | ICD-10-CM | POA: Diagnosis not present

## 2022-11-10 DIAGNOSIS — L821 Other seborrheic keratosis: Secondary | ICD-10-CM | POA: Diagnosis not present

## 2022-11-10 DIAGNOSIS — L570 Actinic keratosis: Secondary | ICD-10-CM | POA: Diagnosis not present

## 2022-12-15 DIAGNOSIS — M79671 Pain in right foot: Secondary | ICD-10-CM | POA: Diagnosis not present

## 2022-12-15 DIAGNOSIS — M79672 Pain in left foot: Secondary | ICD-10-CM | POA: Diagnosis not present

## 2023-02-02 DIAGNOSIS — F3132 Bipolar disorder, current episode depressed, moderate: Secondary | ICD-10-CM | POA: Diagnosis not present

## 2023-05-05 DIAGNOSIS — F3132 Bipolar disorder, current episode depressed, moderate: Secondary | ICD-10-CM | POA: Diagnosis not present

## 2023-06-07 DIAGNOSIS — L570 Actinic keratosis: Secondary | ICD-10-CM | POA: Diagnosis not present

## 2023-06-07 DIAGNOSIS — L820 Inflamed seborrheic keratosis: Secondary | ICD-10-CM | POA: Diagnosis not present

## 2023-06-07 DIAGNOSIS — L821 Other seborrheic keratosis: Secondary | ICD-10-CM | POA: Diagnosis not present

## 2023-06-07 DIAGNOSIS — L538 Other specified erythematous conditions: Secondary | ICD-10-CM | POA: Diagnosis not present

## 2023-06-07 DIAGNOSIS — L298 Other pruritus: Secondary | ICD-10-CM | POA: Diagnosis not present

## 2023-06-07 DIAGNOSIS — L817 Pigmented purpuric dermatosis: Secondary | ICD-10-CM | POA: Diagnosis not present

## 2023-06-07 DIAGNOSIS — X32XXXA Exposure to sunlight, initial encounter: Secondary | ICD-10-CM | POA: Diagnosis not present

## 2023-06-07 DIAGNOSIS — L814 Other melanin hyperpigmentation: Secondary | ICD-10-CM | POA: Diagnosis not present

## 2023-06-07 DIAGNOSIS — D1801 Hemangioma of skin and subcutaneous tissue: Secondary | ICD-10-CM | POA: Diagnosis not present

## 2023-07-05 DIAGNOSIS — R7301 Impaired fasting glucose: Secondary | ICD-10-CM | POA: Diagnosis not present

## 2023-07-05 DIAGNOSIS — E785 Hyperlipidemia, unspecified: Secondary | ICD-10-CM | POA: Diagnosis not present

## 2023-07-05 DIAGNOSIS — Z1212 Encounter for screening for malignant neoplasm of rectum: Secondary | ICD-10-CM | POA: Diagnosis not present

## 2023-07-05 DIAGNOSIS — Z125 Encounter for screening for malignant neoplasm of prostate: Secondary | ICD-10-CM | POA: Diagnosis not present

## 2023-07-12 DIAGNOSIS — I700 Atherosclerosis of aorta: Secondary | ICD-10-CM | POA: Diagnosis not present

## 2023-07-12 DIAGNOSIS — Z79899 Other long term (current) drug therapy: Secondary | ICD-10-CM | POA: Diagnosis not present

## 2023-07-12 DIAGNOSIS — R82998 Other abnormal findings in urine: Secondary | ICD-10-CM | POA: Diagnosis not present

## 2023-07-12 DIAGNOSIS — Z23 Encounter for immunization: Secondary | ICD-10-CM | POA: Diagnosis not present

## 2023-07-12 DIAGNOSIS — Z0000 Encounter for general adult medical examination without abnormal findings: Secondary | ICD-10-CM | POA: Diagnosis not present

## 2023-09-15 DIAGNOSIS — F3132 Bipolar disorder, current episode depressed, moderate: Secondary | ICD-10-CM | POA: Diagnosis not present

## 2023-09-20 DIAGNOSIS — Z789 Other specified health status: Secondary | ICD-10-CM | POA: Diagnosis not present

## 2023-09-20 DIAGNOSIS — Z8249 Family history of ischemic heart disease and other diseases of the circulatory system: Secondary | ICD-10-CM | POA: Diagnosis not present

## 2023-09-20 DIAGNOSIS — Z800 Family history of malignant neoplasm of digestive organs: Secondary | ICD-10-CM | POA: Diagnosis not present

## 2023-09-20 DIAGNOSIS — Z634 Disappearance and death of family member: Secondary | ICD-10-CM | POA: Diagnosis not present

## 2023-09-27 DIAGNOSIS — K080 Exfoliation of teeth due to systemic causes: Secondary | ICD-10-CM | POA: Diagnosis not present

## 2023-10-31 DIAGNOSIS — M19072 Primary osteoarthritis, left ankle and foot: Secondary | ICD-10-CM | POA: Diagnosis not present

## 2023-11-02 ENCOUNTER — Encounter: Payer: Self-pay | Admitting: Neurology

## 2023-11-02 ENCOUNTER — Telehealth: Payer: Self-pay | Admitting: Neurology

## 2023-11-02 MED ORDER — TEMAZEPAM 7.5 MG PO CAPS: 7.5000 mg | ORAL_CAPSULE | Freq: Every evening | ORAL | 0 refills | Status: DC | PRN

## 2023-11-02 NOTE — Telephone Encounter (Signed)
 Pt called and stated that he is a "friend" with provider and is wanting to discuss some things with her. Please advise.

## 2023-11-02 NOTE — Telephone Encounter (Signed)
 Insomnia, history of OSA but currently not  treated for sleep apnea- has hst with cardiology ( Dr Tresa Endo).  I would love for him to have a mail in test. I will order 21 days of low dose temazepam, 7.5 mg.   Virtual visit to follow.  Melvyn Novas, MD

## 2023-11-03 ENCOUNTER — Telehealth: Payer: Self-pay | Admitting: Cardiovascular Disease

## 2023-11-03 NOTE — Telephone Encounter (Signed)
 Progress note November 03, 2023.  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  At that time, he was followed by Dr. Dedra Gores.  In August 2021, due to progressive symptoms of witnessed apnea and gasping for breath he underwent a subsequent sleep study which showed severe sleep apnea with an AHI of 36.7 overall and during REM sleep 56.5/h.  The patient had used CPAP following his last evaluation.  Unfortunately, his wife became critically ill and ultimately passed away.  Subsequently, he has lost approximately 50 pounds.  He stopped using CPAP almost 3 years ago.  Recently, he is now noticing his sleep is very poor and he contacted me by phone and we had a lengthy conversation.  His dog has been barking in middle the night and he is concerned perhaps that the dog is sensing morning or potential apneic events.  I discussed options with the patient.  At present he wishes to pursue a home sleep study for reassessment of his sleep apnea particularly with his 50 pound weight loss.  We will schedule him for a home sleep test and further recommendations will be made upon completion of the study.  Debby DELENA Sor, MD, [facility], ABSM Diplomate, American Board of Sleep Medicine 11/03/2023  4:14 PM

## 2023-11-07 NOTE — Telephone Encounter (Signed)
 Called the patient and there was no answer. LVM advising that Dr Chalice was wanting us  to get him on the books for a apt at the end of the day on a Monday or tues. LVM asking for the patient to call back and advise which day would be better. I will add him into a 4 pm slot for Dr Chalice on whichever day he prefers. Just have to make sure that I verify with Dr Dohmeier to make sure she has nothing scheduled on whatever day he decides.

## 2023-11-08 DIAGNOSIS — F39 Unspecified mood [affective] disorder: Secondary | ICD-10-CM | POA: Diagnosis not present

## 2023-11-08 DIAGNOSIS — I129 Hypertensive chronic kidney disease with stage 1 through stage 4 chronic kidney disease, or unspecified chronic kidney disease: Secondary | ICD-10-CM | POA: Diagnosis not present

## 2023-11-08 NOTE — Telephone Encounter (Signed)
 Dr Vickey Huger is aware that patient was called and VM was left for pt to call back and schedule.

## 2023-11-11 DIAGNOSIS — M25572 Pain in left ankle and joints of left foot: Secondary | ICD-10-CM | POA: Diagnosis not present

## 2023-11-15 DIAGNOSIS — L821 Other seborrheic keratosis: Secondary | ICD-10-CM | POA: Diagnosis not present

## 2023-11-15 DIAGNOSIS — C44329 Squamous cell carcinoma of skin of other parts of face: Secondary | ICD-10-CM | POA: Diagnosis not present

## 2023-11-15 DIAGNOSIS — L570 Actinic keratosis: Secondary | ICD-10-CM | POA: Diagnosis not present

## 2023-11-15 DIAGNOSIS — L720 Epidermal cyst: Secondary | ICD-10-CM | POA: Diagnosis not present

## 2023-11-16 DIAGNOSIS — M19072 Primary osteoarthritis, left ankle and foot: Secondary | ICD-10-CM | POA: Diagnosis not present

## 2023-11-21 DIAGNOSIS — M25562 Pain in left knee: Secondary | ICD-10-CM | POA: Diagnosis not present

## 2023-11-21 DIAGNOSIS — M25572 Pain in left ankle and joints of left foot: Secondary | ICD-10-CM | POA: Diagnosis not present

## 2023-11-21 DIAGNOSIS — R2681 Unsteadiness on feet: Secondary | ICD-10-CM | POA: Diagnosis not present

## 2023-11-21 DIAGNOSIS — M25561 Pain in right knee: Secondary | ICD-10-CM | POA: Diagnosis not present

## 2023-11-23 DIAGNOSIS — M25562 Pain in left knee: Secondary | ICD-10-CM | POA: Diagnosis not present

## 2023-11-23 DIAGNOSIS — M25572 Pain in left ankle and joints of left foot: Secondary | ICD-10-CM | POA: Diagnosis not present

## 2023-11-23 DIAGNOSIS — R2681 Unsteadiness on feet: Secondary | ICD-10-CM | POA: Diagnosis not present

## 2023-11-23 DIAGNOSIS — M25561 Pain in right knee: Secondary | ICD-10-CM | POA: Diagnosis not present

## 2023-11-30 DIAGNOSIS — G4730 Sleep apnea, unspecified: Secondary | ICD-10-CM | POA: Diagnosis not present

## 2023-11-30 DIAGNOSIS — R2681 Unsteadiness on feet: Secondary | ICD-10-CM | POA: Diagnosis not present

## 2023-11-30 DIAGNOSIS — M25572 Pain in left ankle and joints of left foot: Secondary | ICD-10-CM | POA: Diagnosis not present

## 2023-11-30 DIAGNOSIS — M25561 Pain in right knee: Secondary | ICD-10-CM | POA: Diagnosis not present

## 2023-11-30 DIAGNOSIS — M25562 Pain in left knee: Secondary | ICD-10-CM | POA: Diagnosis not present

## 2023-12-07 DIAGNOSIS — M25562 Pain in left knee: Secondary | ICD-10-CM | POA: Diagnosis not present

## 2023-12-07 DIAGNOSIS — R2681 Unsteadiness on feet: Secondary | ICD-10-CM | POA: Diagnosis not present

## 2023-12-07 DIAGNOSIS — M25572 Pain in left ankle and joints of left foot: Secondary | ICD-10-CM | POA: Diagnosis not present

## 2023-12-07 DIAGNOSIS — M25561 Pain in right knee: Secondary | ICD-10-CM | POA: Diagnosis not present

## 2023-12-08 DIAGNOSIS — M19072 Primary osteoarthritis, left ankle and foot: Secondary | ICD-10-CM | POA: Diagnosis not present

## 2023-12-09 DIAGNOSIS — M170 Bilateral primary osteoarthritis of knee: Secondary | ICD-10-CM | POA: Diagnosis not present

## 2023-12-13 ENCOUNTER — Encounter: Payer: Self-pay | Admitting: Neurology

## 2023-12-13 ENCOUNTER — Ambulatory Visit: Payer: PPO | Admitting: Neurology

## 2023-12-13 VITALS — BP 130/68 | HR 72 | Ht 74.0 in | Wt 307.0 lb

## 2023-12-13 DIAGNOSIS — G4733 Obstructive sleep apnea (adult) (pediatric): Secondary | ICD-10-CM | POA: Diagnosis not present

## 2023-12-13 DIAGNOSIS — F5104 Psychophysiologic insomnia: Secondary | ICD-10-CM | POA: Insufficient documentation

## 2023-12-13 DIAGNOSIS — F4321 Adjustment disorder with depressed mood: Secondary | ICD-10-CM | POA: Diagnosis not present

## 2023-12-13 DIAGNOSIS — F518 Other sleep disorders not due to a substance or known physiological condition: Secondary | ICD-10-CM | POA: Diagnosis not present

## 2023-12-13 MED ORDER — ZALEPLON 10 MG PO CAPS: 10.0000 mg | ORAL_CAPSULE | Freq: Every evening | ORAL | 1 refills | Status: AC | PRN

## 2023-12-13 MED ORDER — ZALEPLON 10 MG PO CAPS: 10.0000 mg | ORAL_CAPSULE | Freq: Every evening | ORAL | 1 refills | Status: DC | PRN
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
## 2023-12-19 DIAGNOSIS — M25562 Pain in left knee: Secondary | ICD-10-CM | POA: Diagnosis not present

## 2023-12-19 DIAGNOSIS — M25572 Pain in left ankle and joints of left foot: Secondary | ICD-10-CM | POA: Diagnosis not present

## 2023-12-19 DIAGNOSIS — R2681 Unsteadiness on feet: Secondary | ICD-10-CM | POA: Diagnosis not present

## 2023-12-19 DIAGNOSIS — M25561 Pain in right knee: Secondary | ICD-10-CM | POA: Diagnosis not present

## 2024-01-03 ENCOUNTER — Encounter: Payer: Self-pay | Admitting: Cardiovascular Disease

## 2024-01-09 DIAGNOSIS — M25561 Pain in right knee: Secondary | ICD-10-CM | POA: Diagnosis not present

## 2024-01-09 DIAGNOSIS — M25572 Pain in left ankle and joints of left foot: Secondary | ICD-10-CM | POA: Diagnosis not present

## 2024-01-09 DIAGNOSIS — M25562 Pain in left knee: Secondary | ICD-10-CM | POA: Diagnosis not present

## 2024-01-09 DIAGNOSIS — R2681 Unsteadiness on feet: Secondary | ICD-10-CM | POA: Diagnosis not present

## 2024-01-16 DIAGNOSIS — M25561 Pain in right knee: Secondary | ICD-10-CM | POA: Diagnosis not present

## 2024-01-16 DIAGNOSIS — R2681 Unsteadiness on feet: Secondary | ICD-10-CM | POA: Diagnosis not present

## 2024-01-16 DIAGNOSIS — M25562 Pain in left knee: Secondary | ICD-10-CM | POA: Diagnosis not present

## 2024-01-16 DIAGNOSIS — M25572 Pain in left ankle and joints of left foot: Secondary | ICD-10-CM | POA: Diagnosis not present

## 2024-01-24 DIAGNOSIS — K080 Exfoliation of teeth due to systemic causes: Secondary | ICD-10-CM | POA: Diagnosis not present

## 2024-02-15 DIAGNOSIS — R2681 Unsteadiness on feet: Secondary | ICD-10-CM | POA: Diagnosis not present

## 2024-02-15 DIAGNOSIS — M25572 Pain in left ankle and joints of left foot: Secondary | ICD-10-CM | POA: Diagnosis not present

## 2024-02-15 DIAGNOSIS — M25561 Pain in right knee: Secondary | ICD-10-CM | POA: Diagnosis not present

## 2024-02-15 DIAGNOSIS — M25562 Pain in left knee: Secondary | ICD-10-CM | POA: Diagnosis not present

## 2024-02-21 DIAGNOSIS — I129 Hypertensive chronic kidney disease with stage 1 through stage 4 chronic kidney disease, or unspecified chronic kidney disease: Secondary | ICD-10-CM | POA: Diagnosis not present

## 2024-02-21 DIAGNOSIS — M5030 Other cervical disc degeneration, unspecified cervical region: Secondary | ICD-10-CM | POA: Diagnosis not present

## 2024-02-21 DIAGNOSIS — Z1389 Encounter for screening for other disorder: Secondary | ICD-10-CM | POA: Diagnosis not present

## 2024-02-21 DIAGNOSIS — I10 Essential (primary) hypertension: Secondary | ICD-10-CM | POA: Diagnosis not present

## 2024-02-21 DIAGNOSIS — R7301 Impaired fasting glucose: Secondary | ICD-10-CM | POA: Diagnosis not present

## 2024-03-05 DIAGNOSIS — M19172 Post-traumatic osteoarthritis, left ankle and foot: Secondary | ICD-10-CM | POA: Diagnosis not present

## 2024-03-05 DIAGNOSIS — M19072 Primary osteoarthritis, left ankle and foot: Secondary | ICD-10-CM | POA: Diagnosis not present

## 2024-03-05 DIAGNOSIS — M19071 Primary osteoarthritis, right ankle and foot: Secondary | ICD-10-CM | POA: Diagnosis not present

## 2024-03-05 DIAGNOSIS — G8929 Other chronic pain: Secondary | ICD-10-CM | POA: Diagnosis not present

## 2024-03-05 DIAGNOSIS — M25571 Pain in right ankle and joints of right foot: Secondary | ICD-10-CM | POA: Diagnosis not present

## 2024-03-06 DIAGNOSIS — M19271 Secondary osteoarthritis, right ankle and foot: Secondary | ICD-10-CM | POA: Diagnosis not present

## 2024-03-07 DIAGNOSIS — F3132 Bipolar disorder, current episode depressed, moderate: Secondary | ICD-10-CM | POA: Diagnosis not present

## 2024-04-03 DIAGNOSIS — M19072 Primary osteoarthritis, left ankle and foot: Secondary | ICD-10-CM | POA: Diagnosis not present

## 2024-04-03 DIAGNOSIS — M21072 Valgus deformity, not elsewhere classified, left ankle: Secondary | ICD-10-CM | POA: Diagnosis not present

## 2024-04-03 DIAGNOSIS — Z01818 Encounter for other preprocedural examination: Secondary | ICD-10-CM | POA: Diagnosis not present

## 2024-04-03 DIAGNOSIS — M19071 Primary osteoarthritis, right ankle and foot: Secondary | ICD-10-CM | POA: Diagnosis not present

## 2024-04-04 ENCOUNTER — Encounter (HOSPITAL_COMMUNITY)

## 2024-05-03 ENCOUNTER — Ambulatory Visit (HOSPITAL_COMMUNITY)
Admission: RE | Admit: 2024-05-03 | Discharge: 2024-05-03 | Disposition: A | Discharge status: Home / Self Care | Source: Ambulatory Visit | Attending: Internal Medicine | Admitting: Internal Medicine

## 2024-05-03 VITALS — BP 134/77 | HR 74 | Temp 97.1°F | Resp 17

## 2024-05-03 DIAGNOSIS — E785 Hyperlipidemia, unspecified: Secondary | ICD-10-CM | POA: Diagnosis not present

## 2024-05-03 MED ORDER — INCLISIRAN SODIUM 284 MG/1.5ML ~~LOC~~ SOSY
284.0000 mg | PREFILLED_SYRINGE | SUBCUTANEOUS | Status: DC
  Administered 2024-05-03: 284 mg via SUBCUTANEOUS

## 2024-05-03 MED ORDER — INCLISIRAN SODIUM 284 MG/1.5ML ~~LOC~~ SOSY
PREFILLED_SYRINGE | SUBCUTANEOUS | Status: AC
Start: 2024-05-03 — End: 2024-05-03
  Filled 2024-05-03: qty 1.5

## 2024-05-18 DIAGNOSIS — Z01818 Encounter for other preprocedural examination: Secondary | ICD-10-CM | POA: Diagnosis not present

## 2024-05-18 DIAGNOSIS — M19172 Post-traumatic osteoarthritis, left ankle and foot: Secondary | ICD-10-CM | POA: Diagnosis not present

## 2024-05-18 DIAGNOSIS — I10 Essential (primary) hypertension: Secondary | ICD-10-CM | POA: Diagnosis not present

## 2024-05-18 DIAGNOSIS — J45909 Unspecified asthma, uncomplicated: Secondary | ICD-10-CM | POA: Diagnosis not present

## 2024-05-18 DIAGNOSIS — M19072 Primary osteoarthritis, left ankle and foot: Secondary | ICD-10-CM | POA: Diagnosis not present

## 2024-05-18 DIAGNOSIS — G4733 Obstructive sleep apnea (adult) (pediatric): Secondary | ICD-10-CM | POA: Diagnosis not present

## 2024-05-18 DIAGNOSIS — E785 Hyperlipidemia, unspecified: Secondary | ICD-10-CM | POA: Diagnosis not present

## 2024-05-24 DIAGNOSIS — K080 Exfoliation of teeth due to systemic causes: Secondary | ICD-10-CM | POA: Diagnosis not present

## 2024-06-08 DIAGNOSIS — J45909 Unspecified asthma, uncomplicated: Secondary | ICD-10-CM | POA: Diagnosis not present

## 2024-06-08 DIAGNOSIS — Z91048 Other nonmedicinal substance allergy status: Secondary | ICD-10-CM | POA: Diagnosis not present

## 2024-06-08 DIAGNOSIS — M19072 Primary osteoarthritis, left ankle and foot: Secondary | ICD-10-CM | POA: Diagnosis not present

## 2024-06-08 DIAGNOSIS — G8918 Other acute postprocedural pain: Secondary | ICD-10-CM | POA: Diagnosis not present

## 2024-06-08 DIAGNOSIS — Z79899 Other long term (current) drug therapy: Secondary | ICD-10-CM | POA: Diagnosis not present

## 2024-06-08 DIAGNOSIS — M67472 Ganglion, left ankle and foot: Secondary | ICD-10-CM | POA: Diagnosis not present

## 2024-06-08 DIAGNOSIS — I10 Essential (primary) hypertension: Secondary | ICD-10-CM | POA: Diagnosis not present

## 2024-06-08 DIAGNOSIS — G4733 Obstructive sleep apnea (adult) (pediatric): Secondary | ICD-10-CM | POA: Diagnosis not present

## 2024-06-08 DIAGNOSIS — M25372 Other instability, left ankle: Secondary | ICD-10-CM | POA: Diagnosis not present

## 2024-06-08 DIAGNOSIS — Z888 Allergy status to other drugs, medicaments and biological substances status: Secondary | ICD-10-CM | POA: Diagnosis not present

## 2024-06-08 DIAGNOSIS — K219 Gastro-esophageal reflux disease without esophagitis: Secondary | ICD-10-CM | POA: Diagnosis not present

## 2024-06-08 DIAGNOSIS — E66812 Obesity, class 2: Secondary | ICD-10-CM | POA: Diagnosis not present

## 2024-06-08 DIAGNOSIS — M85672 Other cyst of bone, left ankle and foot: Secondary | ICD-10-CM | POA: Diagnosis not present

## 2024-06-08 DIAGNOSIS — Z6838 Body mass index (BMI) 38.0-38.9, adult: Secondary | ICD-10-CM | POA: Diagnosis not present

## 2024-06-09 DIAGNOSIS — Z6838 Body mass index (BMI) 38.0-38.9, adult: Secondary | ICD-10-CM | POA: Diagnosis not present

## 2024-06-09 DIAGNOSIS — M67472 Ganglion, left ankle and foot: Secondary | ICD-10-CM | POA: Diagnosis not present

## 2024-06-09 DIAGNOSIS — M25372 Other instability, left ankle: Secondary | ICD-10-CM | POA: Diagnosis not present

## 2024-06-09 DIAGNOSIS — I10 Essential (primary) hypertension: Secondary | ICD-10-CM | POA: Diagnosis not present

## 2024-06-09 DIAGNOSIS — E66812 Obesity, class 2: Secondary | ICD-10-CM | POA: Diagnosis not present

## 2024-06-09 DIAGNOSIS — G8918 Other acute postprocedural pain: Secondary | ICD-10-CM | POA: Diagnosis not present

## 2024-06-09 DIAGNOSIS — Z91048 Other nonmedicinal substance allergy status: Secondary | ICD-10-CM | POA: Diagnosis not present

## 2024-06-09 DIAGNOSIS — M19072 Primary osteoarthritis, left ankle and foot: Secondary | ICD-10-CM | POA: Diagnosis not present

## 2024-06-09 DIAGNOSIS — M85672 Other cyst of bone, left ankle and foot: Secondary | ICD-10-CM | POA: Diagnosis not present

## 2024-06-09 DIAGNOSIS — K219 Gastro-esophageal reflux disease without esophagitis: Secondary | ICD-10-CM | POA: Diagnosis not present

## 2024-06-09 DIAGNOSIS — Z888 Allergy status to other drugs, medicaments and biological substances status: Secondary | ICD-10-CM | POA: Diagnosis not present

## 2024-06-09 DIAGNOSIS — J45909 Unspecified asthma, uncomplicated: Secondary | ICD-10-CM | POA: Diagnosis not present

## 2024-06-09 DIAGNOSIS — G4733 Obstructive sleep apnea (adult) (pediatric): Secondary | ICD-10-CM | POA: Diagnosis not present

## 2024-06-09 DIAGNOSIS — Z79899 Other long term (current) drug therapy: Secondary | ICD-10-CM | POA: Diagnosis not present

## 2024-06-29 ENCOUNTER — Telehealth (HOSPITAL_COMMUNITY): Payer: Self-pay

## 2024-06-29 DIAGNOSIS — M19172 Post-traumatic osteoarthritis, left ankle and foot: Secondary | ICD-10-CM | POA: Diagnosis not present

## 2024-06-29 NOTE — Telephone Encounter (Signed)
 Auth Submission: Approved Site of care: Site of care: [facility] Payer: BCBS Medicare Medication & CPT/J Code(s) submitted: Leqvio  (Inclisiran) J1306 Diagnosis Code: E78.5 Route of submission (phone, fax, portal):  Phone # Fax # Auth type: Buy/Bill HB Units/visits requested: 284mg  q 6 months Reference number: [id number] Approval from: 04/02/24 to 10/24/24

## 2024-07-06 ENCOUNTER — Encounter (HOSPITAL_COMMUNITY)

## 2024-07-20 DIAGNOSIS — M19172 Post-traumatic osteoarthritis, left ankle and foot: Secondary | ICD-10-CM | POA: Diagnosis not present

## 2024-07-30 DIAGNOSIS — M79642 Pain in left hand: Secondary | ICD-10-CM | POA: Diagnosis not present

## 2024-07-30 DIAGNOSIS — R2232 Localized swelling, mass and lump, left upper limb: Secondary | ICD-10-CM | POA: Diagnosis not present

## 2024-08-02 DIAGNOSIS — M5451 Vertebrogenic low back pain: Secondary | ICD-10-CM | POA: Diagnosis not present

## 2024-08-02 DIAGNOSIS — R2689 Other abnormalities of gait and mobility: Secondary | ICD-10-CM | POA: Diagnosis not present

## 2024-08-02 DIAGNOSIS — M6281 Muscle weakness (generalized): Secondary | ICD-10-CM | POA: Diagnosis not present

## 2024-08-02 DIAGNOSIS — M9904 Segmental and somatic dysfunction of sacral region: Secondary | ICD-10-CM | POA: Diagnosis not present

## 2024-08-03 ENCOUNTER — Inpatient Hospital Stay (HOSPITAL_COMMUNITY): Admission: RE | Admit: 2024-08-03 | Source: Ambulatory Visit

## 2024-08-08 DIAGNOSIS — Z1389 Encounter for screening for other disorder: Secondary | ICD-10-CM | POA: Diagnosis not present

## 2024-08-08 DIAGNOSIS — Z125 Encounter for screening for malignant neoplasm of prostate: Secondary | ICD-10-CM | POA: Diagnosis not present

## 2024-08-08 DIAGNOSIS — E7849 Other hyperlipidemia: Secondary | ICD-10-CM | POA: Diagnosis not present

## 2024-08-08 DIAGNOSIS — Z23 Encounter for immunization: Secondary | ICD-10-CM | POA: Diagnosis not present

## 2024-08-08 DIAGNOSIS — I129 Hypertensive chronic kidney disease with stage 1 through stage 4 chronic kidney disease, or unspecified chronic kidney disease: Secondary | ICD-10-CM | POA: Diagnosis not present

## 2024-08-08 DIAGNOSIS — Z1212 Encounter for screening for malignant neoplasm of rectum: Secondary | ICD-10-CM | POA: Diagnosis not present

## 2024-08-08 DIAGNOSIS — R7301 Impaired fasting glucose: Secondary | ICD-10-CM | POA: Diagnosis not present

## 2024-08-08 DIAGNOSIS — E291 Testicular hypofunction: Secondary | ICD-10-CM | POA: Diagnosis not present

## 2024-08-08 DIAGNOSIS — Z0000 Encounter for general adult medical examination without abnormal findings: Secondary | ICD-10-CM | POA: Diagnosis not present

## 2024-08-09 ENCOUNTER — Ambulatory Visit (HOSPITAL_COMMUNITY)
Admission: RE | Admit: 2024-08-09 | Discharge: 2024-08-09 | Disposition: A | Discharge status: Home / Self Care | Source: Ambulatory Visit | Attending: Internal Medicine | Admitting: Internal Medicine

## 2024-08-09 VITALS — BP 127/67 | HR 78 | Temp 97.9°F | Resp 16

## 2024-08-09 DIAGNOSIS — Z79899 Other long term (current) drug therapy: Secondary | ICD-10-CM | POA: Diagnosis not present

## 2024-08-09 DIAGNOSIS — E785 Hyperlipidemia, unspecified: Secondary | ICD-10-CM | POA: Diagnosis present

## 2024-08-09 DIAGNOSIS — F3132 Bipolar disorder, current episode depressed, moderate: Secondary | ICD-10-CM | POA: Diagnosis not present

## 2024-08-09 MED ORDER — INCLISIRAN SODIUM 284 MG/1.5ML ~~LOC~~ SOSY
PREFILLED_SYRINGE | SUBCUTANEOUS | Status: AC
  Filled 2024-08-09: qty 1.5

## 2024-08-09 MED ORDER — INCLISIRAN SODIUM 284 MG/1.5ML ~~LOC~~ SOSY
284.0000 mg | PREFILLED_SYRINGE | Freq: Once | SUBCUTANEOUS | Status: AC
  Administered 2024-08-09: 284 mg via SUBCUTANEOUS

## 2024-08-23 DIAGNOSIS — M6281 Muscle weakness (generalized): Secondary | ICD-10-CM | POA: Diagnosis not present

## 2024-08-23 DIAGNOSIS — R2689 Other abnormalities of gait and mobility: Secondary | ICD-10-CM | POA: Diagnosis not present

## 2024-08-23 DIAGNOSIS — M9904 Segmental and somatic dysfunction of sacral region: Secondary | ICD-10-CM | POA: Diagnosis not present

## 2024-08-23 DIAGNOSIS — M5451 Vertebrogenic low back pain: Secondary | ICD-10-CM | POA: Diagnosis not present

## 2024-08-30 DIAGNOSIS — M9904 Segmental and somatic dysfunction of sacral region: Secondary | ICD-10-CM | POA: Diagnosis not present

## 2024-08-30 DIAGNOSIS — M5451 Vertebrogenic low back pain: Secondary | ICD-10-CM | POA: Diagnosis not present

## 2024-08-30 DIAGNOSIS — M6281 Muscle weakness (generalized): Secondary | ICD-10-CM | POA: Diagnosis not present

## 2024-08-30 DIAGNOSIS — R2689 Other abnormalities of gait and mobility: Secondary | ICD-10-CM | POA: Diagnosis not present

## 2024-09-03 DIAGNOSIS — S60221A Contusion of right hand, initial encounter: Secondary | ICD-10-CM | POA: Diagnosis not present

## 2024-09-03 DIAGNOSIS — X500XXA Overexertion from strenuous movement or load, initial encounter: Secondary | ICD-10-CM | POA: Diagnosis not present

## 2024-09-03 DIAGNOSIS — R2232 Localized swelling, mass and lump, left upper limb: Secondary | ICD-10-CM | POA: Diagnosis not present

## 2024-09-03 DIAGNOSIS — S60222A Contusion of left hand, initial encounter: Secondary | ICD-10-CM | POA: Diagnosis not present

## 2024-09-03 DIAGNOSIS — M180 Bilateral primary osteoarthritis of first carpometacarpal joints: Secondary | ICD-10-CM | POA: Diagnosis not present

## 2024-09-03 DIAGNOSIS — M19072 Primary osteoarthritis, left ankle and foot: Secondary | ICD-10-CM | POA: Diagnosis not present

## 2024-09-04 DIAGNOSIS — H25813 Combined forms of age-related cataract, bilateral: Secondary | ICD-10-CM | POA: Diagnosis not present

## 2024-09-07 DIAGNOSIS — M6281 Muscle weakness (generalized): Secondary | ICD-10-CM | POA: Diagnosis not present

## 2024-09-07 DIAGNOSIS — R2689 Other abnormalities of gait and mobility: Secondary | ICD-10-CM | POA: Diagnosis not present

## 2024-09-07 DIAGNOSIS — M9904 Segmental and somatic dysfunction of sacral region: Secondary | ICD-10-CM | POA: Diagnosis not present

## 2024-09-07 DIAGNOSIS — M5451 Vertebrogenic low back pain: Secondary | ICD-10-CM | POA: Diagnosis not present

## 2024-09-24 DIAGNOSIS — M25572 Pain in left ankle and joints of left foot: Secondary | ICD-10-CM | POA: Diagnosis not present

## 2024-09-24 DIAGNOSIS — R2689 Other abnormalities of gait and mobility: Secondary | ICD-10-CM | POA: Diagnosis not present

## 2024-09-24 DIAGNOSIS — M6281 Muscle weakness (generalized): Secondary | ICD-10-CM | POA: Diagnosis not present

## 2024-10-01 DIAGNOSIS — L570 Actinic keratosis: Secondary | ICD-10-CM | POA: Diagnosis not present

## 2024-10-02 DIAGNOSIS — M170 Bilateral primary osteoarthritis of knee: Secondary | ICD-10-CM | POA: Diagnosis not present

## 2024-10-03 DIAGNOSIS — M25572 Pain in left ankle and joints of left foot: Secondary | ICD-10-CM | POA: Diagnosis not present

## 2024-10-03 DIAGNOSIS — R2689 Other abnormalities of gait and mobility: Secondary | ICD-10-CM | POA: Diagnosis not present

## 2024-10-03 DIAGNOSIS — M6281 Muscle weakness (generalized): Secondary | ICD-10-CM | POA: Diagnosis not present

## 2024-10-05 DIAGNOSIS — M25572 Pain in left ankle and joints of left foot: Secondary | ICD-10-CM | POA: Diagnosis not present

## 2024-10-05 DIAGNOSIS — R2689 Other abnormalities of gait and mobility: Secondary | ICD-10-CM | POA: Diagnosis not present

## 2024-10-05 DIAGNOSIS — M6281 Muscle weakness (generalized): Secondary | ICD-10-CM | POA: Diagnosis not present

## 2024-10-09 DIAGNOSIS — M7989 Other specified soft tissue disorders: Secondary | ICD-10-CM | POA: Diagnosis not present

## 2024-10-09 DIAGNOSIS — M6281 Muscle weakness (generalized): Secondary | ICD-10-CM | POA: Diagnosis not present

## 2024-10-09 DIAGNOSIS — Z0000 Encounter for general adult medical examination without abnormal findings: Secondary | ICD-10-CM | POA: Diagnosis not present

## 2024-10-09 DIAGNOSIS — R2689 Other abnormalities of gait and mobility: Secondary | ICD-10-CM | POA: Diagnosis not present

## 2024-10-09 DIAGNOSIS — F3170 Bipolar disorder, currently in remission, most recent episode unspecified: Secondary | ICD-10-CM | POA: Diagnosis not present

## 2024-10-09 DIAGNOSIS — M170 Bilateral primary osteoarthritis of knee: Secondary | ICD-10-CM | POA: Diagnosis not present

## 2024-10-09 DIAGNOSIS — M25572 Pain in left ankle and joints of left foot: Secondary | ICD-10-CM | POA: Diagnosis not present

## 2024-10-09 DIAGNOSIS — Z1331 Encounter for screening for depression: Secondary | ICD-10-CM | POA: Diagnosis not present

## 2024-10-09 DIAGNOSIS — I10 Essential (primary) hypertension: Secondary | ICD-10-CM | POA: Diagnosis not present

## 2024-10-10 DIAGNOSIS — M7989 Other specified soft tissue disorders: Secondary | ICD-10-CM | POA: Diagnosis not present

## 2024-10-26 ENCOUNTER — Encounter (HOSPITAL_COMMUNITY): Payer: Self-pay | Admitting: Internal Medicine

## 2024-10-26 ENCOUNTER — Other Ambulatory Visit (HOSPITAL_COMMUNITY): Payer: Self-pay

## 2024-10-26 MED ORDER — MOUNJARO 12.5 MG/0.5ML ~~LOC~~ SOAJ
12.5000 mg | SUBCUTANEOUS | 3 refills | Status: AC
  Filled 2024-10-26: qty 2, 28d supply, fill #0

## 2024-10-29 ENCOUNTER — Other Ambulatory Visit (HOSPITAL_COMMUNITY): Payer: Self-pay

## 2025-02-08 ENCOUNTER — Encounter (HOSPITAL_COMMUNITY)
